# Patient Record
Sex: Female | Born: 1935 | Race: White | Hispanic: No | Smoking: Never smoker
Health system: Southern US, Community
[De-identification: ages and names within clinical notes are randomized; demographics above are authoritative.]

## PROBLEM LIST (undated history)

## (undated) DIAGNOSIS — M810 Age-related osteoporosis without current pathological fracture: Secondary | ICD-10-CM

## (undated) DIAGNOSIS — I10 Essential (primary) hypertension: Secondary | ICD-10-CM

---

## 1997-06-08 ENCOUNTER — Ambulatory Visit (HOSPITAL_COMMUNITY): Admission: RE | Admit: 1997-06-08 | Discharge: 1997-06-08 | Payer: Self-pay | Admitting: Endocrinology

## 1997-08-27 ENCOUNTER — Ambulatory Visit (HOSPITAL_COMMUNITY): Admission: RE | Admit: 1997-08-27 | Discharge: 1997-08-27 | Payer: Self-pay | Admitting: Obstetrics & Gynecology

## 2000-03-14 ENCOUNTER — Ambulatory Visit (HOSPITAL_COMMUNITY): Admission: RE | Admit: 2000-03-14 | Discharge: 2000-03-14 | Payer: Self-pay | Admitting: Specialist

## 2000-05-01 ENCOUNTER — Encounter: Payer: Self-pay | Admitting: Specialist

## 2000-05-02 ENCOUNTER — Ambulatory Visit (HOSPITAL_COMMUNITY): Admission: RE | Admit: 2000-05-02 | Discharge: 2000-05-02 | Payer: Self-pay | Admitting: Specialist

## 2003-12-08 ENCOUNTER — Ambulatory Visit: Payer: Self-pay | Admitting: Family Medicine

## 2006-12-07 ENCOUNTER — Encounter: Payer: Self-pay | Admitting: Family Medicine

## 2006-12-25 ENCOUNTER — Ambulatory Visit: Payer: Self-pay | Admitting: Family Medicine

## 2006-12-25 DIAGNOSIS — E039 Hypothyroidism, unspecified: Secondary | ICD-10-CM | POA: Insufficient documentation

## 2006-12-25 DIAGNOSIS — J309 Allergic rhinitis, unspecified: Secondary | ICD-10-CM | POA: Insufficient documentation

## 2006-12-25 DIAGNOSIS — E785 Hyperlipidemia, unspecified: Secondary | ICD-10-CM

## 2006-12-25 DIAGNOSIS — R32 Unspecified urinary incontinence: Secondary | ICD-10-CM | POA: Insufficient documentation

## 2006-12-26 ENCOUNTER — Telehealth (INDEPENDENT_AMBULATORY_CARE_PROVIDER_SITE_OTHER): Payer: Self-pay | Admitting: *Deleted

## 2006-12-28 ENCOUNTER — Encounter: Payer: Self-pay | Admitting: Family Medicine

## 2007-01-30 ENCOUNTER — Ambulatory Visit: Payer: Self-pay | Admitting: Gastroenterology

## 2007-02-07 ENCOUNTER — Encounter: Payer: Self-pay | Admitting: Gastroenterology

## 2007-02-07 ENCOUNTER — Encounter: Payer: Self-pay | Admitting: Family Medicine

## 2007-02-07 ENCOUNTER — Ambulatory Visit: Payer: Self-pay | Admitting: Gastroenterology

## 2008-04-13 ENCOUNTER — Encounter: Payer: Self-pay | Admitting: Family Medicine

## 2008-10-29 ENCOUNTER — Ambulatory Visit: Payer: Self-pay | Admitting: Family Medicine

## 2008-10-29 DIAGNOSIS — R1011 Right upper quadrant pain: Secondary | ICD-10-CM

## 2008-11-02 ENCOUNTER — Encounter: Admission: RE | Admit: 2008-11-02 | Discharge: 2008-11-02 | Payer: Self-pay | Admitting: Family Medicine

## 2009-02-09 ENCOUNTER — Ambulatory Visit: Payer: Self-pay | Admitting: Family Medicine

## 2009-02-12 LAB — CONVERTED CEMR LAB
ALT: 15 units/L (ref 0–35)
AST: 22 units/L (ref 0–37)
Albumin: 3.8 g/dL (ref 3.5–5.2)
Alkaline Phosphatase: 89 units/L (ref 39–117)
Bilirubin, Direct: 0.1 mg/dL (ref 0.0–0.3)
Cholesterol: 138 mg/dL (ref 0–200)
HDL: 34.1 mg/dL — ABNORMAL LOW (ref >39.00)
LDL Cholesterol: 73 mg/dL (ref 0–99)
Total Bilirubin: 1 mg/dL (ref 0.3–1.2)
Total CHOL/HDL Ratio: 4
Total Protein: 7 g/dL (ref 6.0–8.3)
Triglycerides: 156 mg/dL — ABNORMAL HIGH (ref 0.0–149.0)
VLDL: 31.2 mg/dL (ref 0.0–40.0)

## 2010-02-13 LAB — CONVERTED CEMR LAB
ALT: 16 units/L (ref 0–35)
ALT: 21 units/L (ref 0–35)
AST: 19 units/L (ref 0–37)
AST: 22 units/L (ref 0–37)
Albumin: 3.9 g/dL (ref 3.5–5.2)
Albumin: 4.1 g/dL (ref 3.5–5.2)
Alkaline Phosphatase: 88 units/L (ref 39–117)
Alkaline Phosphatase: 92 units/L (ref 39–117)
Amylase: 42 units/L (ref 27–131)
BUN: 16 mg/dL (ref 6–23)
BUN: 18 mg/dL (ref 6–23)
Basophils Absolute: 0.1 10*3/uL (ref 0.0–0.1)
Basophils Absolute: 0.1 10*3/uL (ref 0.0–0.1)
Basophils Relative: 0.9 % (ref 0.0–1.0)
Basophils Relative: 0.9 % (ref 0.0–3.0)
Bilirubin Urine: NEGATIVE
Bilirubin Urine: NEGATIVE
Bilirubin, Direct: 0 mg/dL (ref 0.0–0.3)
Bilirubin, Direct: 0.2 mg/dL (ref 0.0–0.3)
Blood in Urine, dipstick: NEGATIVE
Blood in Urine, dipstick: NEGATIVE
CO2: 28 meq/L (ref 19–32)
CO2: 31 meq/L (ref 19–32)
Calcium: 9.1 mg/dL (ref 8.4–10.5)
Calcium: 9.8 mg/dL (ref 8.4–10.5)
Chloride: 102 meq/L (ref 96–112)
Chloride: 105 meq/L (ref 96–112)
Cholesterol: 144 mg/dL (ref 0–200)
Cholesterol: 212 mg/dL — ABNORMAL HIGH (ref 0–200)
Creatinine, Ser: 0.8 mg/dL (ref 0.4–1.2)
Creatinine, Ser: 0.8 mg/dL (ref 0.4–1.2)
Direct LDL: 143.2 mg/dL
Eosinophils Absolute: 0.1 10*3/uL (ref 0.0–0.6)
Eosinophils Absolute: 0.1 10*3/uL (ref 0.0–0.7)
Eosinophils Relative: 0.9 % (ref 0.0–5.0)
Eosinophils Relative: 1.9 % (ref 0.0–5.0)
GFR calc Af Amer: 89 mL/min
GFR calc non Af Amer: 72.95 mL/min (ref >60)
GFR calc non Af Amer: 73 mL/min
Glucose, Bld: 108 mg/dL — ABNORMAL HIGH (ref 70–99)
Glucose, Bld: 88 mg/dL (ref 70–99)
Glucose, Urine, Semiquant: NEGATIVE
Glucose, Urine, Semiquant: NEGATIVE
HCT: 42 % (ref 36.0–46.0)
HCT: 43.3 % (ref 36.0–46.0)
HDL: 28.8 mg/dL — ABNORMAL LOW (ref >39.00)
HDL: 31.3 mg/dL — ABNORMAL LOW (ref >39.0)
Hemoglobin: 14.2 g/dL (ref 12.0–15.0)
Hemoglobin: 14.6 g/dL (ref 12.0–15.0)
Ketones, urine, test strip: NEGATIVE
LDL Cholesterol: 88 mg/dL (ref 0–99)
Lipase: 11 units/L (ref 11.0–59.0)
Lymphocytes Relative: 23.6 % (ref 12.0–46.0)
Lymphocytes Relative: 24.8 % (ref 12.0–46.0)
Lymphs Abs: 1.4 10*3/uL (ref 0.7–4.0)
MCHC: 33.7 g/dL (ref 30.0–36.0)
MCHC: 33.7 g/dL (ref 30.0–36.0)
MCV: 86.1 fL (ref 78.0–100.0)
MCV: 86.2 fL (ref 78.0–100.0)
Monocytes Absolute: 0.6 10*3/uL (ref 0.1–1.0)
Monocytes Absolute: 0.9 10*3/uL — ABNORMAL HIGH (ref 0.2–0.7)
Monocytes Relative: 10.8 % (ref 3.0–12.0)
Monocytes Relative: 11 % (ref 3.0–11.0)
Neutro Abs: 3.6 10*3/uL (ref 1.4–7.7)
Neutro Abs: 5.1 10*3/uL (ref 1.4–7.7)
Neutrophils Relative %: 61.6 % (ref 43.0–77.0)
Neutrophils Relative %: 63.6 % (ref 43.0–77.0)
Nitrite: NEGATIVE
Nitrite: NEGATIVE
Platelets: 213 10*3/uL (ref 150.0–400.0)
Platelets: 267 10*3/uL (ref 150–400)
Potassium: 3.8 meq/L (ref 3.5–5.1)
Potassium: 4.1 meq/L (ref 3.5–5.1)
Protein, U semiquant: NEGATIVE
RBC: 4.88 M/uL (ref 3.87–5.11)
RBC: 5.02 M/uL (ref 3.87–5.11)
RDW: 12.8 % (ref 11.5–14.6)
RDW: 13 % (ref 11.5–14.6)
Sodium: 142 meq/L (ref 135–145)
Sodium: 142 meq/L (ref 135–145)
Specific Gravity, Urine: 1.025
Specific Gravity, Urine: 1.025
TSH: 0.71 microintl units/mL (ref 0.35–5.50)
TSH: 1.01 microintl units/mL (ref 0.35–5.50)
Total Bilirubin: 0.9 mg/dL (ref 0.3–1.2)
Total Bilirubin: 1 mg/dL (ref 0.3–1.2)
Total CHOL/HDL Ratio: 4.6
Total CHOL/HDL Ratio: 7
Total Protein: 6.9 g/dL (ref 6.0–8.3)
Total Protein: 7.1 g/dL (ref 6.0–8.3)
Triglycerides: 125 mg/dL (ref 0–149)
Triglycerides: 210 mg/dL — ABNORMAL HIGH (ref 0.0–149.0)
Urobilinogen, UA: 0.2
Urobilinogen, UA: 0.2
VLDL: 25 mg/dL (ref 0–40)
VLDL: 42 mg/dL — ABNORMAL HIGH (ref 0.0–40.0)
WBC: 5.8 10*3/uL (ref 4.5–10.5)
WBC: 8.1 10*3/uL (ref 4.5–10.5)
pH: 5
pH: 5

## 2010-02-17 NOTE — Assessment & Plan Note (Signed)
Summary: fup---will fast//ccm   Vital Signs:  Patient profile:   77 year old female Weight:      191 pounds Temp:     98.3 degrees F oral Pulse rate:   88 / minute BP sitting:   134 / 72  (left arm) Cuff size:   large  Vitals Entered By: Alfred Levins, CMA (February 09, 2009 8:20 AM) CC: f/u, fasting   History of Present Illness: Here to follow up after starting on Zocor about 3 months ago for her elevated lipids. She feels good, and she has made some big adjustments in her diet.   Current Medications (verified): 1)  Zocor 40 Mg Tabs (Simvastatin) .... One By Mouth Daily 2)  Claritin 10 Mg Caps (Loratadine) .... Once Daily  Allergies (verified): No Known Drug Allergies  Past History:  Past Medical History: Allergic rhinitis Hypothyroidism Urinary incontinence, sees Dr. Earlene Plater Hyperlipidemia Positive PPD, treated macular degeneration, sees Dr. Hazle Quant benign thyroid cysts renal cysts  Review of Systems  The patient denies anorexia, fever, weight loss, weight gain, vision loss, decreased hearing, hoarseness, chest pain, syncope, dyspnea on exertion, peripheral edema, prolonged cough, headaches, hemoptysis, abdominal pain, melena, hematochezia, severe indigestion/heartburn, hematuria, incontinence, genital sores, muscle weakness, suspicious skin lesions, transient blindness, difficulty walking, depression, unusual weight change, abnormal bleeding, enlarged lymph nodes, angioedema, breast masses, and testicular masses.    Physical Exam  General:  Well-developed,well-nourished,in no acute distress; alert,appropriate and cooperative throughout examination   Impression & Recommendations:  Problem # 1:  HYPERLIPIDEMIA (ICD-272.4)  Her updated medication list for this problem includes:    Zocor 40 Mg Tabs (Simvastatin) ..... One by mouth daily  Orders: Venipuncture (04540) TLB-Lipid Panel (80061-LIPID) TLB-Hepatic/Liver Function Pnl (80076-HEPATIC)  Complete Medication  List: 1)  Zocor 40 Mg Tabs (Simvastatin) .... One by mouth daily 2)  Claritin 10 Mg Caps (Loratadine) .... Once daily  Patient Instructions: 1)  Get labs today

## 2010-05-31 NOTE — Letter (Signed)
January 30, 2007    Mrs. [name]   RE:  Sydney, Guzman  MRN:  [id number]  /  DOB:  28-Jun-1924   Dear [name]:   It is my pleasure to have treated you recently as a new patient in my  office.  I appreciate your confidence and the opportunity to participate  in your care.   Since I do have a busy inpatient endoscopy schedule and office schedule,  my office hours vary weekly.  I am, however, available for emergency  calls every day through my office.  If I cannot promptly meet an urgent  office appointment, another one of our gastroenterologists will be able  to assist you.   My well-trained staff are prepared to help you at all times.  For  emergencies after office hours, a physician from our gastroenterology  section is always available through my 24-hour answering service.   While you are under my care, I encourage discussion of your questions  and concerns, and I will be happy to return your calls as soon as I am  available.   Once again, I welcome you as a new patient and I look forward to a happy  and healthy relationship.    Sincerely,      Barbette Hair. Arlyce Dice, MD,FACG  Electronically Signed   RDK/MedQ  DD: 01/30/2007  DT: 01/30/2007  Job #: [id number]

## 2010-05-31 NOTE — Assessment & Plan Note (Signed)
[facility]                         GASTROENTEROLOGY OFFICE NOTE   NAME:[name], [name]                      MRN:          [id number]  DATE:01/30/2007                            DOB:          04-22-1924    REASON FOR CONSULTATION:  Colorectal cancer screening.   [name] is a very pleasant 77 year old white female referred through  the courtesy of Dr. Clent Ridges for evaluation.  She has never undergone  screening colonoscopy and has requested one.  Except for mild rectal  discomfort, which she attributes to hemorrhoids, she has no GI  complaints.  Specifically, she is without pain, change in bowel habits,  melena, or hematochezia.   PAST MEDICAL HISTORY:  Pertinent for:  1. Thyroid disease.  2. History of melanoma diagnosed and treated in 1982.  3. She has a cyst on the kidney.  4. Status post hysterectomy.   FAMILY HISTORY:  Pertinent for father who had lung cancer.  Mother died  at 21.   MEDICATIONS:  Include Vytorin, Synthroid, baby aspirin.   ALLERGIES:  No known drug allergies.   SOCIAL HISTORY:  She neither smokes or drinks.  She is married and  retired.   REVIEW OF SYSTEMS:  Positive for some vision changes and muscle cramps.   PHYSICAL EXAMINATION:  VITAL SIGNS:  Pulse 72, blood pressure 140/72,  weight 194.  HEENT: EOMI.  PERRLA.  Sclerae are anicteric.  Conjunctivae are pink.  NECK:  Supple without thyromegaly, adenopathy or carotid bruits.  CHEST:  Clear to auscultation and percussion without adventitious  sounds.  CARDIAC:  Regular rhythm; normal S1 S2.  There are no murmurs, gallops  or rubs.  ABDOMEN:  Bowel sounds are normoactive.  Abdomen is soft, nontender and  nondistended.  There are no abdominal masses, tenderness, splenic  enlargement or hepatomegaly.  EXTREMITIES:  Full range of motion.  No cyanosis, clubbing or edema.  RECTAL:  Deferred.   ASSESSMENT AND PLAN:  I will schedule [name] for screening   colonoscopy.    Barbette Hair. Arlyce Dice, MD,FACG  Electronically Signed   RDK/MedQ  DD: 01/30/2007  DT: 01/30/2007  Job #: [id number]   cc:   Jeannett Senior A. Clent Ridges, MD

## 2010-11-22 ENCOUNTER — Other Ambulatory Visit: Payer: Self-pay | Admitting: Dermatology

## 2011-01-25 DIAGNOSIS — Z1231 Encounter for screening mammogram for malignant neoplasm of breast: Secondary | ICD-10-CM | POA: Diagnosis not present

## 2011-05-29 DIAGNOSIS — L578 Other skin changes due to chronic exposure to nonionizing radiation: Secondary | ICD-10-CM | POA: Diagnosis not present

## 2011-05-29 DIAGNOSIS — H353190 Nonexudative age-related macular degeneration, unspecified eye, stage unspecified: Secondary | ICD-10-CM | POA: Diagnosis not present

## 2011-05-29 DIAGNOSIS — L821 Other seborrheic keratosis: Secondary | ICD-10-CM | POA: Diagnosis not present

## 2011-05-29 DIAGNOSIS — L570 Actinic keratosis: Secondary | ICD-10-CM | POA: Diagnosis not present

## 2011-06-19 DIAGNOSIS — M810 Age-related osteoporosis without current pathological fracture: Secondary | ICD-10-CM | POA: Diagnosis not present

## 2011-06-19 DIAGNOSIS — E785 Hyperlipidemia, unspecified: Secondary | ICD-10-CM | POA: Diagnosis not present

## 2011-06-19 DIAGNOSIS — R7301 Impaired fasting glucose: Secondary | ICD-10-CM | POA: Diagnosis not present

## 2011-06-19 DIAGNOSIS — I10 Essential (primary) hypertension: Secondary | ICD-10-CM | POA: Diagnosis not present

## 2011-10-12 DIAGNOSIS — Z23 Encounter for immunization: Secondary | ICD-10-CM | POA: Diagnosis not present

## 2011-11-07 DIAGNOSIS — M949 Disorder of cartilage, unspecified: Secondary | ICD-10-CM | POA: Diagnosis not present

## 2011-11-07 DIAGNOSIS — M899 Disorder of bone, unspecified: Secondary | ICD-10-CM | POA: Diagnosis not present

## 2011-12-05 DIAGNOSIS — L821 Other seborrheic keratosis: Secondary | ICD-10-CM | POA: Diagnosis not present

## 2011-12-05 DIAGNOSIS — Z85820 Personal history of malignant melanoma of skin: Secondary | ICD-10-CM | POA: Diagnosis not present

## 2011-12-05 DIAGNOSIS — D239 Other benign neoplasm of skin, unspecified: Secondary | ICD-10-CM | POA: Diagnosis not present

## 2011-12-05 DIAGNOSIS — L819 Disorder of pigmentation, unspecified: Secondary | ICD-10-CM | POA: Diagnosis not present

## 2011-12-11 DIAGNOSIS — H353190 Nonexudative age-related macular degeneration, unspecified eye, stage unspecified: Secondary | ICD-10-CM | POA: Diagnosis not present

## 2012-01-30 DIAGNOSIS — H903 Sensorineural hearing loss, bilateral: Secondary | ICD-10-CM | POA: Diagnosis not present

## 2012-02-23 DIAGNOSIS — R82998 Other abnormal findings in urine: Secondary | ICD-10-CM | POA: Diagnosis not present

## 2012-02-23 DIAGNOSIS — R030 Elevated blood-pressure reading, without diagnosis of hypertension: Secondary | ICD-10-CM | POA: Diagnosis not present

## 2012-02-23 DIAGNOSIS — M810 Age-related osteoporosis without current pathological fracture: Secondary | ICD-10-CM | POA: Diagnosis not present

## 2012-02-23 DIAGNOSIS — E785 Hyperlipidemia, unspecified: Secondary | ICD-10-CM | POA: Diagnosis not present

## 2012-02-23 DIAGNOSIS — R7301 Impaired fasting glucose: Secondary | ICD-10-CM | POA: Diagnosis not present

## 2012-03-04 DIAGNOSIS — Z1212 Encounter for screening for malignant neoplasm of rectum: Secondary | ICD-10-CM | POA: Diagnosis not present

## 2012-03-13 DIAGNOSIS — I10 Essential (primary) hypertension: Secondary | ICD-10-CM | POA: Diagnosis not present

## 2012-03-13 DIAGNOSIS — E785 Hyperlipidemia, unspecified: Secondary | ICD-10-CM | POA: Diagnosis not present

## 2012-03-13 DIAGNOSIS — Z124 Encounter for screening for malignant neoplasm of cervix: Secondary | ICD-10-CM | POA: Diagnosis not present

## 2012-03-13 DIAGNOSIS — Z0000 Encounter for general adult medical examination without abnormal findings: Secondary | ICD-10-CM | POA: Diagnosis not present

## 2012-03-13 DIAGNOSIS — Z1331 Encounter for screening for depression: Secondary | ICD-10-CM | POA: Diagnosis not present

## 2012-06-03 ENCOUNTER — Other Ambulatory Visit: Payer: Self-pay | Admitting: Dermatology

## 2012-06-03 DIAGNOSIS — D2360 Other benign neoplasm of skin of unspecified upper limb, including shoulder: Secondary | ICD-10-CM | POA: Diagnosis not present

## 2012-06-03 DIAGNOSIS — D485 Neoplasm of uncertain behavior of skin: Secondary | ICD-10-CM | POA: Diagnosis not present

## 2012-06-03 DIAGNOSIS — L819 Disorder of pigmentation, unspecified: Secondary | ICD-10-CM | POA: Diagnosis not present

## 2012-06-03 DIAGNOSIS — Z85820 Personal history of malignant melanoma of skin: Secondary | ICD-10-CM | POA: Diagnosis not present

## 2012-06-03 DIAGNOSIS — L821 Other seborrheic keratosis: Secondary | ICD-10-CM | POA: Diagnosis not present

## 2012-06-03 DIAGNOSIS — Z85828 Personal history of other malignant neoplasm of skin: Secondary | ICD-10-CM | POA: Diagnosis not present

## 2012-06-03 DIAGNOSIS — L578 Other skin changes due to chronic exposure to nonionizing radiation: Secondary | ICD-10-CM | POA: Diagnosis not present

## 2012-07-18 DIAGNOSIS — Z1231 Encounter for screening mammogram for malignant neoplasm of breast: Secondary | ICD-10-CM | POA: Diagnosis not present

## 2012-08-12 DIAGNOSIS — H353190 Nonexudative age-related macular degeneration, unspecified eye, stage unspecified: Secondary | ICD-10-CM | POA: Diagnosis not present

## 2012-08-14 DIAGNOSIS — Z23 Encounter for immunization: Secondary | ICD-10-CM | POA: Diagnosis not present

## 2012-11-11 DIAGNOSIS — Z23 Encounter for immunization: Secondary | ICD-10-CM | POA: Diagnosis not present

## 2013-02-13 DIAGNOSIS — H04129 Dry eye syndrome of unspecified lacrimal gland: Secondary | ICD-10-CM | POA: Diagnosis not present

## 2013-02-13 DIAGNOSIS — H353190 Nonexudative age-related macular degeneration, unspecified eye, stage unspecified: Secondary | ICD-10-CM | POA: Diagnosis not present

## 2013-03-14 DIAGNOSIS — E785 Hyperlipidemia, unspecified: Secondary | ICD-10-CM | POA: Diagnosis not present

## 2013-03-14 DIAGNOSIS — M810 Age-related osteoporosis without current pathological fracture: Secondary | ICD-10-CM | POA: Diagnosis not present

## 2013-03-14 DIAGNOSIS — I10 Essential (primary) hypertension: Secondary | ICD-10-CM | POA: Diagnosis not present

## 2013-03-14 DIAGNOSIS — R7301 Impaired fasting glucose: Secondary | ICD-10-CM | POA: Diagnosis not present

## 2013-03-14 DIAGNOSIS — R82998 Other abnormal findings in urine: Secondary | ICD-10-CM | POA: Diagnosis not present

## 2013-03-20 DIAGNOSIS — Z1212 Encounter for screening for malignant neoplasm of rectum: Secondary | ICD-10-CM | POA: Diagnosis not present

## 2013-03-20 DIAGNOSIS — M810 Age-related osteoporosis without current pathological fracture: Secondary | ICD-10-CM | POA: Diagnosis not present

## 2013-03-20 DIAGNOSIS — E669 Obesity, unspecified: Secondary | ICD-10-CM | POA: Diagnosis not present

## 2013-03-20 DIAGNOSIS — R030 Elevated blood-pressure reading, without diagnosis of hypertension: Secondary | ICD-10-CM | POA: Diagnosis not present

## 2013-03-20 DIAGNOSIS — Z0000 Encounter for general adult medical examination without abnormal findings: Secondary | ICD-10-CM | POA: Diagnosis not present

## 2013-03-20 DIAGNOSIS — Z1331 Encounter for screening for depression: Secondary | ICD-10-CM | POA: Diagnosis not present

## 2013-03-20 DIAGNOSIS — I10 Essential (primary) hypertension: Secondary | ICD-10-CM | POA: Diagnosis not present

## 2013-03-20 DIAGNOSIS — E785 Hyperlipidemia, unspecified: Secondary | ICD-10-CM | POA: Diagnosis not present

## 2013-03-20 DIAGNOSIS — R7301 Impaired fasting glucose: Secondary | ICD-10-CM | POA: Diagnosis not present

## 2013-05-29 DIAGNOSIS — M545 Low back pain, unspecified: Secondary | ICD-10-CM | POA: Diagnosis not present

## 2013-05-29 DIAGNOSIS — M47814 Spondylosis without myelopathy or radiculopathy, thoracic region: Secondary | ICD-10-CM | POA: Diagnosis not present

## 2013-05-29 DIAGNOSIS — M47817 Spondylosis without myelopathy or radiculopathy, lumbosacral region: Secondary | ICD-10-CM | POA: Diagnosis not present

## 2013-05-29 DIAGNOSIS — Z6832 Body mass index (BMI) 32.0-32.9, adult: Secondary | ICD-10-CM | POA: Diagnosis not present

## 2013-06-05 DIAGNOSIS — IMO0002 Reserved for concepts with insufficient information to code with codable children: Secondary | ICD-10-CM | POA: Diagnosis not present

## 2013-06-05 DIAGNOSIS — M25659 Stiffness of unspecified hip, not elsewhere classified: Secondary | ICD-10-CM | POA: Diagnosis not present

## 2013-06-05 DIAGNOSIS — M999 Biomechanical lesion, unspecified: Secondary | ICD-10-CM | POA: Diagnosis not present

## 2013-06-05 DIAGNOSIS — M5137 Other intervertebral disc degeneration, lumbosacral region: Secondary | ICD-10-CM | POA: Diagnosis not present

## 2013-06-10 DIAGNOSIS — M5137 Other intervertebral disc degeneration, lumbosacral region: Secondary | ICD-10-CM | POA: Diagnosis not present

## 2013-06-10 DIAGNOSIS — M25659 Stiffness of unspecified hip, not elsewhere classified: Secondary | ICD-10-CM | POA: Diagnosis not present

## 2013-06-10 DIAGNOSIS — M999 Biomechanical lesion, unspecified: Secondary | ICD-10-CM | POA: Diagnosis not present

## 2013-06-10 DIAGNOSIS — IMO0002 Reserved for concepts with insufficient information to code with codable children: Secondary | ICD-10-CM | POA: Diagnosis not present

## 2013-06-11 DIAGNOSIS — M999 Biomechanical lesion, unspecified: Secondary | ICD-10-CM | POA: Diagnosis not present

## 2013-06-11 DIAGNOSIS — IMO0002 Reserved for concepts with insufficient information to code with codable children: Secondary | ICD-10-CM | POA: Diagnosis not present

## 2013-06-11 DIAGNOSIS — M25659 Stiffness of unspecified hip, not elsewhere classified: Secondary | ICD-10-CM | POA: Diagnosis not present

## 2013-06-11 DIAGNOSIS — M5137 Other intervertebral disc degeneration, lumbosacral region: Secondary | ICD-10-CM | POA: Diagnosis not present

## 2013-06-13 DIAGNOSIS — IMO0002 Reserved for concepts with insufficient information to code with codable children: Secondary | ICD-10-CM | POA: Diagnosis not present

## 2013-06-13 DIAGNOSIS — M25659 Stiffness of unspecified hip, not elsewhere classified: Secondary | ICD-10-CM | POA: Diagnosis not present

## 2013-06-13 DIAGNOSIS — M5137 Other intervertebral disc degeneration, lumbosacral region: Secondary | ICD-10-CM | POA: Diagnosis not present

## 2013-06-13 DIAGNOSIS — M999 Biomechanical lesion, unspecified: Secondary | ICD-10-CM | POA: Diagnosis not present

## 2013-06-16 DIAGNOSIS — IMO0002 Reserved for concepts with insufficient information to code with codable children: Secondary | ICD-10-CM | POA: Diagnosis not present

## 2013-06-16 DIAGNOSIS — M25659 Stiffness of unspecified hip, not elsewhere classified: Secondary | ICD-10-CM | POA: Diagnosis not present

## 2013-06-16 DIAGNOSIS — M999 Biomechanical lesion, unspecified: Secondary | ICD-10-CM | POA: Diagnosis not present

## 2013-06-16 DIAGNOSIS — M5137 Other intervertebral disc degeneration, lumbosacral region: Secondary | ICD-10-CM | POA: Diagnosis not present

## 2013-06-17 DIAGNOSIS — M5137 Other intervertebral disc degeneration, lumbosacral region: Secondary | ICD-10-CM | POA: Diagnosis not present

## 2013-06-17 DIAGNOSIS — M999 Biomechanical lesion, unspecified: Secondary | ICD-10-CM | POA: Diagnosis not present

## 2013-06-17 DIAGNOSIS — IMO0002 Reserved for concepts with insufficient information to code with codable children: Secondary | ICD-10-CM | POA: Diagnosis not present

## 2013-06-17 DIAGNOSIS — M25659 Stiffness of unspecified hip, not elsewhere classified: Secondary | ICD-10-CM | POA: Diagnosis not present

## 2013-06-19 DIAGNOSIS — M25659 Stiffness of unspecified hip, not elsewhere classified: Secondary | ICD-10-CM | POA: Diagnosis not present

## 2013-06-19 DIAGNOSIS — M999 Biomechanical lesion, unspecified: Secondary | ICD-10-CM | POA: Diagnosis not present

## 2013-06-19 DIAGNOSIS — IMO0002 Reserved for concepts with insufficient information to code with codable children: Secondary | ICD-10-CM | POA: Diagnosis not present

## 2013-06-19 DIAGNOSIS — M5137 Other intervertebral disc degeneration, lumbosacral region: Secondary | ICD-10-CM | POA: Diagnosis not present

## 2013-06-23 DIAGNOSIS — M5137 Other intervertebral disc degeneration, lumbosacral region: Secondary | ICD-10-CM | POA: Diagnosis not present

## 2013-06-23 DIAGNOSIS — IMO0002 Reserved for concepts with insufficient information to code with codable children: Secondary | ICD-10-CM | POA: Diagnosis not present

## 2013-06-23 DIAGNOSIS — M25659 Stiffness of unspecified hip, not elsewhere classified: Secondary | ICD-10-CM | POA: Diagnosis not present

## 2013-06-23 DIAGNOSIS — M999 Biomechanical lesion, unspecified: Secondary | ICD-10-CM | POA: Diagnosis not present

## 2013-06-24 DIAGNOSIS — M999 Biomechanical lesion, unspecified: Secondary | ICD-10-CM | POA: Diagnosis not present

## 2013-06-24 DIAGNOSIS — IMO0002 Reserved for concepts with insufficient information to code with codable children: Secondary | ICD-10-CM | POA: Diagnosis not present

## 2013-06-24 DIAGNOSIS — M5137 Other intervertebral disc degeneration, lumbosacral region: Secondary | ICD-10-CM | POA: Diagnosis not present

## 2013-06-24 DIAGNOSIS — M25659 Stiffness of unspecified hip, not elsewhere classified: Secondary | ICD-10-CM | POA: Diagnosis not present

## 2013-06-26 DIAGNOSIS — IMO0002 Reserved for concepts with insufficient information to code with codable children: Secondary | ICD-10-CM | POA: Diagnosis not present

## 2013-06-26 DIAGNOSIS — M25659 Stiffness of unspecified hip, not elsewhere classified: Secondary | ICD-10-CM | POA: Diagnosis not present

## 2013-06-26 DIAGNOSIS — M999 Biomechanical lesion, unspecified: Secondary | ICD-10-CM | POA: Diagnosis not present

## 2013-06-26 DIAGNOSIS — M5137 Other intervertebral disc degeneration, lumbosacral region: Secondary | ICD-10-CM | POA: Diagnosis not present

## 2013-06-30 DIAGNOSIS — M5137 Other intervertebral disc degeneration, lumbosacral region: Secondary | ICD-10-CM | POA: Diagnosis not present

## 2013-06-30 DIAGNOSIS — M999 Biomechanical lesion, unspecified: Secondary | ICD-10-CM | POA: Diagnosis not present

## 2013-06-30 DIAGNOSIS — M25659 Stiffness of unspecified hip, not elsewhere classified: Secondary | ICD-10-CM | POA: Diagnosis not present

## 2013-06-30 DIAGNOSIS — IMO0002 Reserved for concepts with insufficient information to code with codable children: Secondary | ICD-10-CM | POA: Diagnosis not present

## 2013-07-03 DIAGNOSIS — M25659 Stiffness of unspecified hip, not elsewhere classified: Secondary | ICD-10-CM | POA: Diagnosis not present

## 2013-07-03 DIAGNOSIS — IMO0002 Reserved for concepts with insufficient information to code with codable children: Secondary | ICD-10-CM | POA: Diagnosis not present

## 2013-07-03 DIAGNOSIS — M5137 Other intervertebral disc degeneration, lumbosacral region: Secondary | ICD-10-CM | POA: Diagnosis not present

## 2013-07-03 DIAGNOSIS — M999 Biomechanical lesion, unspecified: Secondary | ICD-10-CM | POA: Diagnosis not present

## 2013-07-07 DIAGNOSIS — IMO0002 Reserved for concepts with insufficient information to code with codable children: Secondary | ICD-10-CM | POA: Diagnosis not present

## 2013-07-07 DIAGNOSIS — M999 Biomechanical lesion, unspecified: Secondary | ICD-10-CM | POA: Diagnosis not present

## 2013-07-07 DIAGNOSIS — M25659 Stiffness of unspecified hip, not elsewhere classified: Secondary | ICD-10-CM | POA: Diagnosis not present

## 2013-07-07 DIAGNOSIS — M5137 Other intervertebral disc degeneration, lumbosacral region: Secondary | ICD-10-CM | POA: Diagnosis not present

## 2013-07-10 DIAGNOSIS — M5137 Other intervertebral disc degeneration, lumbosacral region: Secondary | ICD-10-CM | POA: Diagnosis not present

## 2013-07-10 DIAGNOSIS — M999 Biomechanical lesion, unspecified: Secondary | ICD-10-CM | POA: Diagnosis not present

## 2013-07-10 DIAGNOSIS — M25659 Stiffness of unspecified hip, not elsewhere classified: Secondary | ICD-10-CM | POA: Diagnosis not present

## 2013-07-10 DIAGNOSIS — IMO0002 Reserved for concepts with insufficient information to code with codable children: Secondary | ICD-10-CM | POA: Diagnosis not present

## 2013-07-14 DIAGNOSIS — M25659 Stiffness of unspecified hip, not elsewhere classified: Secondary | ICD-10-CM | POA: Diagnosis not present

## 2013-07-14 DIAGNOSIS — M5137 Other intervertebral disc degeneration, lumbosacral region: Secondary | ICD-10-CM | POA: Diagnosis not present

## 2013-07-14 DIAGNOSIS — IMO0002 Reserved for concepts with insufficient information to code with codable children: Secondary | ICD-10-CM | POA: Diagnosis not present

## 2013-07-14 DIAGNOSIS — M999 Biomechanical lesion, unspecified: Secondary | ICD-10-CM | POA: Diagnosis not present

## 2013-07-17 DIAGNOSIS — M999 Biomechanical lesion, unspecified: Secondary | ICD-10-CM | POA: Diagnosis not present

## 2013-07-17 DIAGNOSIS — M5137 Other intervertebral disc degeneration, lumbosacral region: Secondary | ICD-10-CM | POA: Diagnosis not present

## 2013-07-17 DIAGNOSIS — M25659 Stiffness of unspecified hip, not elsewhere classified: Secondary | ICD-10-CM | POA: Diagnosis not present

## 2013-07-17 DIAGNOSIS — IMO0002 Reserved for concepts with insufficient information to code with codable children: Secondary | ICD-10-CM | POA: Diagnosis not present

## 2013-07-24 DIAGNOSIS — M25659 Stiffness of unspecified hip, not elsewhere classified: Secondary | ICD-10-CM | POA: Diagnosis not present

## 2013-07-24 DIAGNOSIS — M5137 Other intervertebral disc degeneration, lumbosacral region: Secondary | ICD-10-CM | POA: Diagnosis not present

## 2013-07-24 DIAGNOSIS — M999 Biomechanical lesion, unspecified: Secondary | ICD-10-CM | POA: Diagnosis not present

## 2013-07-24 DIAGNOSIS — IMO0002 Reserved for concepts with insufficient information to code with codable children: Secondary | ICD-10-CM | POA: Diagnosis not present

## 2013-08-07 DIAGNOSIS — M25659 Stiffness of unspecified hip, not elsewhere classified: Secondary | ICD-10-CM | POA: Diagnosis not present

## 2013-08-07 DIAGNOSIS — M999 Biomechanical lesion, unspecified: Secondary | ICD-10-CM | POA: Diagnosis not present

## 2013-08-07 DIAGNOSIS — M5137 Other intervertebral disc degeneration, lumbosacral region: Secondary | ICD-10-CM | POA: Diagnosis not present

## 2013-08-07 DIAGNOSIS — IMO0002 Reserved for concepts with insufficient information to code with codable children: Secondary | ICD-10-CM | POA: Diagnosis not present

## 2013-08-14 DIAGNOSIS — H04129 Dry eye syndrome of unspecified lacrimal gland: Secondary | ICD-10-CM | POA: Diagnosis not present

## 2013-08-14 DIAGNOSIS — H353190 Nonexudative age-related macular degeneration, unspecified eye, stage unspecified: Secondary | ICD-10-CM | POA: Diagnosis not present

## 2013-08-14 DIAGNOSIS — H40019 Open angle with borderline findings, low risk, unspecified eye: Secondary | ICD-10-CM | POA: Diagnosis not present

## 2013-09-18 DIAGNOSIS — M25579 Pain in unspecified ankle and joints of unspecified foot: Secondary | ICD-10-CM | POA: Diagnosis not present

## 2013-09-18 DIAGNOSIS — Z6832 Body mass index (BMI) 32.0-32.9, adult: Secondary | ICD-10-CM | POA: Diagnosis not present

## 2013-11-07 DIAGNOSIS — Z23 Encounter for immunization: Secondary | ICD-10-CM | POA: Diagnosis not present

## 2013-11-18 DIAGNOSIS — M8588 Other specified disorders of bone density and structure, other site: Secondary | ICD-10-CM | POA: Diagnosis not present

## 2014-01-29 DIAGNOSIS — Z1231 Encounter for screening mammogram for malignant neoplasm of breast: Secondary | ICD-10-CM | POA: Diagnosis not present

## 2014-03-23 DIAGNOSIS — R7301 Impaired fasting glucose: Secondary | ICD-10-CM | POA: Diagnosis not present

## 2014-03-23 DIAGNOSIS — E785 Hyperlipidemia, unspecified: Secondary | ICD-10-CM | POA: Diagnosis not present

## 2014-03-23 DIAGNOSIS — M810 Age-related osteoporosis without current pathological fracture: Secondary | ICD-10-CM | POA: Diagnosis not present

## 2014-03-23 DIAGNOSIS — Z008 Encounter for other general examination: Secondary | ICD-10-CM | POA: Diagnosis not present

## 2014-03-23 DIAGNOSIS — R8299 Other abnormal findings in urine: Secondary | ICD-10-CM | POA: Diagnosis not present

## 2014-03-23 DIAGNOSIS — I10 Essential (primary) hypertension: Secondary | ICD-10-CM | POA: Diagnosis not present

## 2014-03-30 DIAGNOSIS — Z6838 Body mass index (BMI) 38.0-38.9, adult: Secondary | ICD-10-CM | POA: Diagnosis not present

## 2014-03-30 DIAGNOSIS — M25512 Pain in left shoulder: Secondary | ICD-10-CM | POA: Diagnosis not present

## 2014-03-30 DIAGNOSIS — E669 Obesity, unspecified: Secondary | ICD-10-CM | POA: Diagnosis not present

## 2014-03-30 DIAGNOSIS — R030 Elevated blood-pressure reading, without diagnosis of hypertension: Secondary | ICD-10-CM | POA: Diagnosis not present

## 2014-03-30 DIAGNOSIS — Z1231 Encounter for screening mammogram for malignant neoplasm of breast: Secondary | ICD-10-CM | POA: Diagnosis not present

## 2014-03-30 DIAGNOSIS — Z1389 Encounter for screening for other disorder: Secondary | ICD-10-CM | POA: Diagnosis not present

## 2014-03-30 DIAGNOSIS — I10 Essential (primary) hypertension: Secondary | ICD-10-CM | POA: Diagnosis not present

## 2014-03-30 DIAGNOSIS — M810 Age-related osteoporosis without current pathological fracture: Secondary | ICD-10-CM | POA: Diagnosis not present

## 2014-03-30 DIAGNOSIS — R7301 Impaired fasting glucose: Secondary | ICD-10-CM | POA: Diagnosis not present

## 2014-03-30 DIAGNOSIS — Z0000 Encounter for general adult medical examination without abnormal findings: Secondary | ICD-10-CM | POA: Diagnosis not present

## 2014-03-30 DIAGNOSIS — M545 Low back pain: Secondary | ICD-10-CM | POA: Diagnosis not present

## 2014-03-30 DIAGNOSIS — E785 Hyperlipidemia, unspecified: Secondary | ICD-10-CM | POA: Diagnosis not present

## 2014-04-01 DIAGNOSIS — Z1212 Encounter for screening for malignant neoplasm of rectum: Secondary | ICD-10-CM | POA: Diagnosis not present

## 2014-04-08 DIAGNOSIS — M25552 Pain in left hip: Secondary | ICD-10-CM | POA: Diagnosis not present

## 2014-04-08 DIAGNOSIS — M7542 Impingement syndrome of left shoulder: Secondary | ICD-10-CM | POA: Diagnosis not present

## 2014-11-24 ENCOUNTER — Encounter: Payer: Self-pay | Admitting: Gastroenterology

## 2014-12-29 DIAGNOSIS — H524 Presbyopia: Secondary | ICD-10-CM | POA: Diagnosis not present

## 2014-12-29 DIAGNOSIS — H353133 Nonexudative age-related macular degeneration, bilateral, advanced atrophic without subfoveal involvement: Secondary | ICD-10-CM | POA: Diagnosis not present

## 2014-12-29 DIAGNOSIS — H04123 Dry eye syndrome of bilateral lacrimal glands: Secondary | ICD-10-CM | POA: Diagnosis not present

## 2014-12-29 DIAGNOSIS — H5213 Myopia, bilateral: Secondary | ICD-10-CM | POA: Diagnosis not present

## 2015-01-05 DIAGNOSIS — L304 Erythema intertrigo: Secondary | ICD-10-CM | POA: Diagnosis not present

## 2015-01-05 DIAGNOSIS — L570 Actinic keratosis: Secondary | ICD-10-CM | POA: Diagnosis not present

## 2015-01-05 DIAGNOSIS — L821 Other seborrheic keratosis: Secondary | ICD-10-CM | POA: Diagnosis not present

## 2015-01-05 DIAGNOSIS — C44712 Basal cell carcinoma of skin of right lower limb, including hip: Secondary | ICD-10-CM | POA: Diagnosis not present

## 2015-01-05 DIAGNOSIS — Z85828 Personal history of other malignant neoplasm of skin: Secondary | ICD-10-CM | POA: Diagnosis not present

## 2015-01-05 DIAGNOSIS — C44319 Basal cell carcinoma of skin of other parts of face: Secondary | ICD-10-CM | POA: Diagnosis not present

## 2015-01-05 DIAGNOSIS — L814 Other melanin hyperpigmentation: Secondary | ICD-10-CM | POA: Diagnosis not present

## 2015-01-05 DIAGNOSIS — Z85820 Personal history of malignant melanoma of skin: Secondary | ICD-10-CM | POA: Diagnosis not present

## 2015-01-05 DIAGNOSIS — D1801 Hemangioma of skin and subcutaneous tissue: Secondary | ICD-10-CM | POA: Diagnosis not present

## 2015-03-03 DIAGNOSIS — Z1231 Encounter for screening mammogram for malignant neoplasm of breast: Secondary | ICD-10-CM | POA: Diagnosis not present

## 2015-04-20 DIAGNOSIS — Z6832 Body mass index (BMI) 32.0-32.9, adult: Secondary | ICD-10-CM | POA: Diagnosis not present

## 2015-04-20 DIAGNOSIS — I10 Essential (primary) hypertension: Secondary | ICD-10-CM | POA: Diagnosis not present

## 2015-04-20 DIAGNOSIS — H6123 Impacted cerumen, bilateral: Secondary | ICD-10-CM | POA: Diagnosis not present

## 2015-05-19 DIAGNOSIS — N390 Urinary tract infection, site not specified: Secondary | ICD-10-CM | POA: Diagnosis not present

## 2015-05-19 DIAGNOSIS — R8299 Other abnormal findings in urine: Secondary | ICD-10-CM | POA: Diagnosis not present

## 2015-05-19 DIAGNOSIS — E784 Other hyperlipidemia: Secondary | ICD-10-CM | POA: Diagnosis not present

## 2015-05-19 DIAGNOSIS — R7301 Impaired fasting glucose: Secondary | ICD-10-CM | POA: Diagnosis not present

## 2015-05-19 DIAGNOSIS — I10 Essential (primary) hypertension: Secondary | ICD-10-CM | POA: Diagnosis not present

## 2015-05-19 DIAGNOSIS — M810 Age-related osteoporosis without current pathological fracture: Secondary | ICD-10-CM | POA: Diagnosis not present

## 2015-05-26 DIAGNOSIS — Z1231 Encounter for screening mammogram for malignant neoplasm of breast: Secondary | ICD-10-CM | POA: Diagnosis not present

## 2015-05-26 DIAGNOSIS — Z1389 Encounter for screening for other disorder: Secondary | ICD-10-CM | POA: Diagnosis not present

## 2015-05-26 DIAGNOSIS — E668 Other obesity: Secondary | ICD-10-CM | POA: Diagnosis not present

## 2015-05-26 DIAGNOSIS — I10 Essential (primary) hypertension: Secondary | ICD-10-CM | POA: Diagnosis not present

## 2015-05-26 DIAGNOSIS — Z6832 Body mass index (BMI) 32.0-32.9, adult: Secondary | ICD-10-CM | POA: Diagnosis not present

## 2015-05-26 DIAGNOSIS — E784 Other hyperlipidemia: Secondary | ICD-10-CM | POA: Diagnosis not present

## 2015-05-26 DIAGNOSIS — R7301 Impaired fasting glucose: Secondary | ICD-10-CM | POA: Diagnosis not present

## 2015-05-26 DIAGNOSIS — M545 Low back pain: Secondary | ICD-10-CM | POA: Diagnosis not present

## 2015-05-26 DIAGNOSIS — Z0000 Encounter for general adult medical examination without abnormal findings: Secondary | ICD-10-CM | POA: Diagnosis not present

## 2015-05-26 DIAGNOSIS — M810 Age-related osteoporosis without current pathological fracture: Secondary | ICD-10-CM | POA: Diagnosis not present

## 2015-07-01 DIAGNOSIS — H524 Presbyopia: Secondary | ICD-10-CM | POA: Diagnosis not present

## 2015-07-01 DIAGNOSIS — Z961 Presence of intraocular lens: Secondary | ICD-10-CM | POA: Diagnosis not present

## 2015-07-01 DIAGNOSIS — H5213 Myopia, bilateral: Secondary | ICD-10-CM | POA: Diagnosis not present

## 2015-07-01 DIAGNOSIS — H04123 Dry eye syndrome of bilateral lacrimal glands: Secondary | ICD-10-CM | POA: Diagnosis not present

## 2015-07-01 DIAGNOSIS — H353133 Nonexudative age-related macular degeneration, bilateral, advanced atrophic without subfoveal involvement: Secondary | ICD-10-CM | POA: Diagnosis not present

## 2015-11-08 DIAGNOSIS — M545 Low back pain: Secondary | ICD-10-CM | POA: Diagnosis not present

## 2015-11-15 DIAGNOSIS — M545 Low back pain: Secondary | ICD-10-CM | POA: Diagnosis not present

## 2015-11-19 DIAGNOSIS — Z23 Encounter for immunization: Secondary | ICD-10-CM | POA: Diagnosis not present

## 2015-11-22 DIAGNOSIS — M545 Low back pain: Secondary | ICD-10-CM | POA: Diagnosis not present

## 2016-01-27 DIAGNOSIS — M810 Age-related osteoporosis without current pathological fracture: Secondary | ICD-10-CM | POA: Diagnosis not present

## 2016-03-13 DIAGNOSIS — Z1231 Encounter for screening mammogram for malignant neoplasm of breast: Secondary | ICD-10-CM | POA: Diagnosis not present

## 2016-04-13 DIAGNOSIS — R509 Fever, unspecified: Secondary | ICD-10-CM | POA: Diagnosis not present

## 2016-05-26 DIAGNOSIS — D1801 Hemangioma of skin and subcutaneous tissue: Secondary | ICD-10-CM | POA: Diagnosis not present

## 2016-05-26 DIAGNOSIS — C44311 Basal cell carcinoma of skin of nose: Secondary | ICD-10-CM | POA: Diagnosis not present

## 2016-05-26 DIAGNOSIS — C4441 Basal cell carcinoma of skin of scalp and neck: Secondary | ICD-10-CM | POA: Diagnosis not present

## 2016-05-26 DIAGNOSIS — L820 Inflamed seborrheic keratosis: Secondary | ICD-10-CM | POA: Diagnosis not present

## 2016-05-26 DIAGNOSIS — Z85820 Personal history of malignant melanoma of skin: Secondary | ICD-10-CM | POA: Diagnosis not present

## 2016-05-26 DIAGNOSIS — L821 Other seborrheic keratosis: Secondary | ICD-10-CM | POA: Diagnosis not present

## 2016-05-26 DIAGNOSIS — L814 Other melanin hyperpigmentation: Secondary | ICD-10-CM | POA: Diagnosis not present

## 2016-05-26 DIAGNOSIS — Z85828 Personal history of other malignant neoplasm of skin: Secondary | ICD-10-CM | POA: Diagnosis not present

## 2016-06-07 DIAGNOSIS — C44311 Basal cell carcinoma of skin of nose: Secondary | ICD-10-CM | POA: Diagnosis not present

## 2016-06-07 DIAGNOSIS — Z85828 Personal history of other malignant neoplasm of skin: Secondary | ICD-10-CM | POA: Diagnosis not present

## 2016-06-07 DIAGNOSIS — Z85820 Personal history of malignant melanoma of skin: Secondary | ICD-10-CM | POA: Diagnosis not present

## 2016-06-14 DIAGNOSIS — E784 Other hyperlipidemia: Secondary | ICD-10-CM | POA: Diagnosis not present

## 2016-06-14 DIAGNOSIS — I10 Essential (primary) hypertension: Secondary | ICD-10-CM | POA: Diagnosis not present

## 2016-06-14 DIAGNOSIS — M810 Age-related osteoporosis without current pathological fracture: Secondary | ICD-10-CM | POA: Diagnosis not present

## 2016-06-14 DIAGNOSIS — R8299 Other abnormal findings in urine: Secondary | ICD-10-CM | POA: Diagnosis not present

## 2016-06-14 DIAGNOSIS — R7301 Impaired fasting glucose: Secondary | ICD-10-CM | POA: Diagnosis not present

## 2016-06-14 DIAGNOSIS — N390 Urinary tract infection, site not specified: Secondary | ICD-10-CM | POA: Diagnosis not present

## 2016-06-15 DIAGNOSIS — Z85828 Personal history of other malignant neoplasm of skin: Secondary | ICD-10-CM | POA: Diagnosis not present

## 2016-06-15 DIAGNOSIS — Z85820 Personal history of malignant melanoma of skin: Secondary | ICD-10-CM | POA: Diagnosis not present

## 2016-06-15 DIAGNOSIS — C4441 Basal cell carcinoma of skin of scalp and neck: Secondary | ICD-10-CM | POA: Diagnosis not present

## 2016-06-21 DIAGNOSIS — Z0000 Encounter for general adult medical examination without abnormal findings: Secondary | ICD-10-CM | POA: Diagnosis not present

## 2016-06-21 DIAGNOSIS — E784 Other hyperlipidemia: Secondary | ICD-10-CM | POA: Diagnosis not present

## 2016-06-21 DIAGNOSIS — E668 Other obesity: Secondary | ICD-10-CM | POA: Diagnosis not present

## 2016-06-21 DIAGNOSIS — Z1389 Encounter for screening for other disorder: Secondary | ICD-10-CM | POA: Diagnosis not present

## 2016-06-21 DIAGNOSIS — M545 Low back pain: Secondary | ICD-10-CM | POA: Diagnosis not present

## 2016-06-21 DIAGNOSIS — R7301 Impaired fasting glucose: Secondary | ICD-10-CM | POA: Diagnosis not present

## 2016-06-21 DIAGNOSIS — M810 Age-related osteoporosis without current pathological fracture: Secondary | ICD-10-CM | POA: Diagnosis not present

## 2016-06-21 DIAGNOSIS — I10 Essential (primary) hypertension: Secondary | ICD-10-CM | POA: Diagnosis not present

## 2016-06-21 DIAGNOSIS — M25571 Pain in right ankle and joints of right foot: Secondary | ICD-10-CM | POA: Diagnosis not present

## 2016-06-21 DIAGNOSIS — M25512 Pain in left shoulder: Secondary | ICD-10-CM | POA: Diagnosis not present

## 2016-06-21 DIAGNOSIS — Z1231 Encounter for screening mammogram for malignant neoplasm of breast: Secondary | ICD-10-CM | POA: Diagnosis not present

## 2016-06-21 DIAGNOSIS — Z6832 Body mass index (BMI) 32.0-32.9, adult: Secondary | ICD-10-CM | POA: Diagnosis not present

## 2016-09-25 DIAGNOSIS — H524 Presbyopia: Secondary | ICD-10-CM | POA: Diagnosis not present

## 2016-09-25 DIAGNOSIS — H04123 Dry eye syndrome of bilateral lacrimal glands: Secondary | ICD-10-CM | POA: Diagnosis not present

## 2016-09-25 DIAGNOSIS — H1859 Other hereditary corneal dystrophies: Secondary | ICD-10-CM | POA: Diagnosis not present

## 2016-09-25 DIAGNOSIS — H353133 Nonexudative age-related macular degeneration, bilateral, advanced atrophic without subfoveal involvement: Secondary | ICD-10-CM | POA: Diagnosis not present

## 2016-09-25 DIAGNOSIS — H5213 Myopia, bilateral: Secondary | ICD-10-CM | POA: Diagnosis not present

## 2016-09-25 DIAGNOSIS — H52223 Regular astigmatism, bilateral: Secondary | ICD-10-CM | POA: Diagnosis not present

## 2016-09-25 DIAGNOSIS — Z961 Presence of intraocular lens: Secondary | ICD-10-CM | POA: Diagnosis not present

## 2016-10-20 DIAGNOSIS — Z23 Encounter for immunization: Secondary | ICD-10-CM | POA: Diagnosis not present

## 2016-12-20 DIAGNOSIS — M109 Gout, unspecified: Secondary | ICD-10-CM | POA: Diagnosis not present

## 2016-12-20 DIAGNOSIS — I10 Essential (primary) hypertension: Secondary | ICD-10-CM | POA: Diagnosis not present

## 2016-12-20 DIAGNOSIS — E7849 Other hyperlipidemia: Secondary | ICD-10-CM | POA: Diagnosis not present

## 2016-12-20 DIAGNOSIS — Z6832 Body mass index (BMI) 32.0-32.9, adult: Secondary | ICD-10-CM | POA: Diagnosis not present

## 2016-12-20 DIAGNOSIS — R7301 Impaired fasting glucose: Secondary | ICD-10-CM | POA: Diagnosis not present

## 2017-04-02 DIAGNOSIS — Z1231 Encounter for screening mammogram for malignant neoplasm of breast: Secondary | ICD-10-CM | POA: Diagnosis not present

## 2017-05-25 DIAGNOSIS — L814 Other melanin hyperpigmentation: Secondary | ICD-10-CM | POA: Diagnosis not present

## 2017-05-25 DIAGNOSIS — Z85828 Personal history of other malignant neoplasm of skin: Secondary | ICD-10-CM | POA: Diagnosis not present

## 2017-05-25 DIAGNOSIS — L821 Other seborrheic keratosis: Secondary | ICD-10-CM | POA: Diagnosis not present

## 2017-05-25 DIAGNOSIS — Z85820 Personal history of malignant melanoma of skin: Secondary | ICD-10-CM | POA: Diagnosis not present

## 2017-05-25 DIAGNOSIS — C4441 Basal cell carcinoma of skin of scalp and neck: Secondary | ICD-10-CM | POA: Diagnosis not present

## 2017-06-21 DIAGNOSIS — H52223 Regular astigmatism, bilateral: Secondary | ICD-10-CM | POA: Diagnosis not present

## 2017-06-21 DIAGNOSIS — H1859 Other hereditary corneal dystrophies: Secondary | ICD-10-CM | POA: Diagnosis not present

## 2017-06-21 DIAGNOSIS — H524 Presbyopia: Secondary | ICD-10-CM | POA: Diagnosis not present

## 2017-06-21 DIAGNOSIS — H04123 Dry eye syndrome of bilateral lacrimal glands: Secondary | ICD-10-CM | POA: Diagnosis not present

## 2017-06-21 DIAGNOSIS — Z961 Presence of intraocular lens: Secondary | ICD-10-CM | POA: Diagnosis not present

## 2017-06-21 DIAGNOSIS — H353133 Nonexudative age-related macular degeneration, bilateral, advanced atrophic without subfoveal involvement: Secondary | ICD-10-CM | POA: Diagnosis not present

## 2017-06-21 DIAGNOSIS — H5213 Myopia, bilateral: Secondary | ICD-10-CM | POA: Diagnosis not present

## 2017-08-16 DIAGNOSIS — H3562 Retinal hemorrhage, left eye: Secondary | ICD-10-CM | POA: Diagnosis not present

## 2017-08-16 DIAGNOSIS — H43813 Vitreous degeneration, bilateral: Secondary | ICD-10-CM | POA: Diagnosis not present

## 2017-08-16 DIAGNOSIS — H353231 Exudative age-related macular degeneration, bilateral, with active choroidal neovascularization: Secondary | ICD-10-CM | POA: Diagnosis not present

## 2017-08-20 DIAGNOSIS — I10 Essential (primary) hypertension: Secondary | ICD-10-CM | POA: Diagnosis not present

## 2017-08-20 DIAGNOSIS — R7301 Impaired fasting glucose: Secondary | ICD-10-CM | POA: Diagnosis not present

## 2017-08-20 DIAGNOSIS — M109 Gout, unspecified: Secondary | ICD-10-CM | POA: Diagnosis not present

## 2017-08-20 DIAGNOSIS — E7849 Other hyperlipidemia: Secondary | ICD-10-CM | POA: Diagnosis not present

## 2017-08-20 DIAGNOSIS — M810 Age-related osteoporosis without current pathological fracture: Secondary | ICD-10-CM | POA: Diagnosis not present

## 2017-08-20 DIAGNOSIS — R82998 Other abnormal findings in urine: Secondary | ICD-10-CM | POA: Diagnosis not present

## 2017-08-27 DIAGNOSIS — I10 Essential (primary) hypertension: Secondary | ICD-10-CM | POA: Diagnosis not present

## 2017-08-27 DIAGNOSIS — Z6832 Body mass index (BMI) 32.0-32.9, adult: Secondary | ICD-10-CM | POA: Diagnosis not present

## 2017-08-27 DIAGNOSIS — Z0000 Encounter for general adult medical examination without abnormal findings: Secondary | ICD-10-CM | POA: Diagnosis not present

## 2017-08-27 DIAGNOSIS — M545 Low back pain: Secondary | ICD-10-CM | POA: Diagnosis not present

## 2017-08-27 DIAGNOSIS — M810 Age-related osteoporosis without current pathological fracture: Secondary | ICD-10-CM | POA: Diagnosis not present

## 2017-08-27 DIAGNOSIS — R7301 Impaired fasting glucose: Secondary | ICD-10-CM | POA: Diagnosis not present

## 2017-08-27 DIAGNOSIS — M25512 Pain in left shoulder: Secondary | ICD-10-CM | POA: Diagnosis not present

## 2017-08-27 DIAGNOSIS — Z1389 Encounter for screening for other disorder: Secondary | ICD-10-CM | POA: Diagnosis not present

## 2017-08-27 DIAGNOSIS — M25571 Pain in right ankle and joints of right foot: Secondary | ICD-10-CM | POA: Diagnosis not present

## 2017-08-27 DIAGNOSIS — E668 Other obesity: Secondary | ICD-10-CM | POA: Diagnosis not present

## 2017-08-27 DIAGNOSIS — M109 Gout, unspecified: Secondary | ICD-10-CM | POA: Diagnosis not present

## 2017-08-27 DIAGNOSIS — E7849 Other hyperlipidemia: Secondary | ICD-10-CM | POA: Diagnosis not present

## 2017-08-31 DIAGNOSIS — Z1212 Encounter for screening for malignant neoplasm of rectum: Secondary | ICD-10-CM | POA: Diagnosis not present

## 2017-09-14 DIAGNOSIS — H353231 Exudative age-related macular degeneration, bilateral, with active choroidal neovascularization: Secondary | ICD-10-CM | POA: Diagnosis not present

## 2017-10-18 DIAGNOSIS — H353231 Exudative age-related macular degeneration, bilateral, with active choroidal neovascularization: Secondary | ICD-10-CM | POA: Diagnosis not present

## 2017-11-16 DIAGNOSIS — Z23 Encounter for immunization: Secondary | ICD-10-CM | POA: Diagnosis not present

## 2017-11-22 DIAGNOSIS — H353231 Exudative age-related macular degeneration, bilateral, with active choroidal neovascularization: Secondary | ICD-10-CM | POA: Diagnosis not present

## 2017-11-22 DIAGNOSIS — H43813 Vitreous degeneration, bilateral: Secondary | ICD-10-CM | POA: Diagnosis not present

## 2017-12-27 DIAGNOSIS — H43813 Vitreous degeneration, bilateral: Secondary | ICD-10-CM | POA: Diagnosis not present

## 2017-12-27 DIAGNOSIS — H353231 Exudative age-related macular degeneration, bilateral, with active choroidal neovascularization: Secondary | ICD-10-CM | POA: Diagnosis not present

## 2017-12-27 DIAGNOSIS — H3562 Retinal hemorrhage, left eye: Secondary | ICD-10-CM | POA: Diagnosis not present

## 2018-01-23 DIAGNOSIS — H53413 Scotoma involving central area, bilateral: Secondary | ICD-10-CM | POA: Diagnosis not present

## 2018-02-07 DIAGNOSIS — H43813 Vitreous degeneration, bilateral: Secondary | ICD-10-CM | POA: Diagnosis not present

## 2018-02-07 DIAGNOSIS — H3562 Retinal hemorrhage, left eye: Secondary | ICD-10-CM | POA: Diagnosis not present

## 2018-02-07 DIAGNOSIS — H353231 Exudative age-related macular degeneration, bilateral, with active choroidal neovascularization: Secondary | ICD-10-CM | POA: Diagnosis not present

## 2018-02-13 DIAGNOSIS — H53413 Scotoma involving central area, bilateral: Secondary | ICD-10-CM | POA: Diagnosis not present

## 2018-02-25 DIAGNOSIS — M545 Low back pain: Secondary | ICD-10-CM | POA: Diagnosis not present

## 2018-02-25 DIAGNOSIS — I10 Essential (primary) hypertension: Secondary | ICD-10-CM | POA: Diagnosis not present

## 2018-02-25 DIAGNOSIS — Z6832 Body mass index (BMI) 32.0-32.9, adult: Secondary | ICD-10-CM | POA: Diagnosis not present

## 2018-02-25 DIAGNOSIS — M810 Age-related osteoporosis without current pathological fracture: Secondary | ICD-10-CM | POA: Diagnosis not present

## 2018-02-25 DIAGNOSIS — R7301 Impaired fasting glucose: Secondary | ICD-10-CM | POA: Diagnosis not present

## 2018-03-06 ENCOUNTER — Ambulatory Visit (HOSPITAL_COMMUNITY)
Admission: RE | Admit: 2018-03-06 | Discharge: 2018-03-06 | Disposition: A | Discharge status: Home / Self Care | Payer: Medicare Other | Source: Ambulatory Visit | Attending: Internal Medicine | Admitting: Internal Medicine

## 2018-03-06 DIAGNOSIS — M810 Age-related osteoporosis without current pathological fracture: Secondary | ICD-10-CM | POA: Diagnosis not present

## 2018-03-06 MED ORDER — DENOSUMAB 60 MG/ML ~~LOC~~ SOSY
PREFILLED_SYRINGE | SUBCUTANEOUS | Status: AC
  Filled 2018-03-06: qty 1

## 2018-03-06 MED ORDER — DENOSUMAB 60 MG/ML ~~LOC~~ SOSY
60.0000 mg | PREFILLED_SYRINGE | Freq: Once | SUBCUTANEOUS | Status: AC
  Administered 2018-03-06: 60 mg via SUBCUTANEOUS

## 2018-03-06 NOTE — Discharge Instructions (Signed)
Denosumab injection °What is this medicine? °DENOSUMAB (den oh sue mab) slows bone breakdown. Prolia is used to treat osteoporosis in women after menopause and in men, and in people who are taking corticosteroids for 6 months or more. Xgeva is used to treat a high calcium level due to cancer and to prevent bone fractures and other bone problems caused by multiple myeloma or cancer bone metastases. Xgeva is also used to treat giant cell tumor of the bone. °This medicine may be used for other purposes; ask your health care provider or pharmacist if you have questions. °COMMON BRAND NAME(S): Prolia, XGEVA °What should I tell my health care provider before I take this medicine? °They need to know if you have any of these conditions: °-dental disease °-having surgery or tooth extraction °-infection °-kidney disease °-low levels of calcium or Vitamin D in the blood °-malnutrition °-on hemodialysis °-skin conditions or sensitivity °-thyroid or parathyroid disease °-an unusual reaction to denosumab, other medicines, foods, dyes, or preservatives °-pregnant or trying to get pregnant °-breast-feeding °How should I use this medicine? °This medicine is for injection under the skin. It is given by a health care professional in a hospital or clinic setting. °A special MedGuide will be given to you before each treatment. Be sure to read this information carefully each time. °For Prolia, talk to your pediatrician regarding the use of this medicine in children. Special care may be needed. For Xgeva, talk to your pediatrician regarding the use of this medicine in children. While this drug may be prescribed for children as young as 13 years for selected conditions, precautions do apply. °Overdosage: If you think you have taken too much of this medicine contact a poison control center or emergency room at once. °NOTE: This medicine is only for you. Do not share this medicine with others. °What if I miss a dose? °It is important not to  miss your dose. Call your doctor or health care professional if you are unable to keep an appointment. °What may interact with this medicine? °Do not take this medicine with any of the following medications: °-other medicines containing denosumab °This medicine may also interact with the following medications: °-medicines that lower your chance of fighting infection °-steroid medicines like prednisone or cortisone °This list may not describe all possible interactions. Give your health care provider a list of all the medicines, herbs, non-prescription drugs, or dietary supplements you use. Also tell them if you smoke, drink alcohol, or use illegal drugs. Some items may interact with your medicine. °What should I watch for while using this medicine? °Visit your doctor or health care professional for regular checks on your progress. Your doctor or health care professional may order blood tests and other tests to see how you are doing. °Call your doctor or health care professional for advice if you get a fever, chills or sore throat, or other symptoms of a cold or flu. Do not treat yourself. This drug may decrease your body's ability to fight infection. Try to avoid being around people who are sick. °You should make sure you get enough calcium and vitamin D while you are taking this medicine, unless your doctor tells you not to. Discuss the foods you eat and the vitamins you take with your health care professional. °See your dentist regularly. Brush and floss your teeth as directed. Before you have any dental work done, tell your dentist you are receiving this medicine. °Do not become pregnant while taking this medicine or for 5 months   after stopping it. Talk with your doctor or health care professional about your birth control options while taking this medicine. Women should inform their doctor if they wish to become pregnant or think they might be pregnant. There is a potential for serious side effects to an unborn  child. Talk to your health care professional or pharmacist for more information. °What side effects may I notice from receiving this medicine? °Side effects that you should report to your doctor or health care professional as soon as possible: °-allergic reactions like skin rash, itching or hives, swelling of the face, lips, or tongue °-bone pain °-breathing problems °-dizziness °-jaw pain, especially after dental work °-redness, blistering, peeling of the skin °-signs and symptoms of infection like fever or chills; cough; sore throat; pain or trouble passing urine °-signs of low calcium like fast heartbeat, muscle cramps or muscle pain; pain, tingling, numbness in the hands or feet; seizures °-unusual bleeding or bruising °-unusually weak or tired °Side effects that usually do not require medical attention (report to your doctor or health care professional if they continue or are bothersome): °-constipation °-diarrhea °-headache °-joint pain °-loss of appetite °-muscle pain °-runny nose °-tiredness °-upset stomach °This list may not describe all possible side effects. Call your doctor for medical advice about side effects. You may report side effects to FDA at 1-800-FDA-1088. °Where should I keep my medicine? °This medicine is only given in a clinic, doctor's office, or other health care setting and will not be stored at home. °NOTE: This sheet is a summary. It may not cover all possible information. If you have questions about this medicine, talk to your doctor, pharmacist, or health care provider. °© 2019 [employer]vier/Gold Standard (2017-05-11 16:10:44) ° °

## 2018-03-19 ENCOUNTER — Emergency Department (INDEPENDENT_AMBULATORY_CARE_PROVIDER_SITE_OTHER): Payer: Medicare Other

## 2018-03-19 ENCOUNTER — Emergency Department (INDEPENDENT_AMBULATORY_CARE_PROVIDER_SITE_OTHER)
Admission: EM | Admit: 2018-03-19 | Discharge: 2018-03-19 | Disposition: A | Discharge status: Home / Self Care | Payer: Medicare Other | Source: Home / Self Care

## 2018-03-19 ENCOUNTER — Other Ambulatory Visit: Payer: Self-pay

## 2018-03-19 ENCOUNTER — Encounter: Payer: Self-pay | Admitting: Emergency Medicine

## 2018-03-19 DIAGNOSIS — M25532 Pain in left wrist: Secondary | ICD-10-CM | POA: Diagnosis not present

## 2018-03-19 DIAGNOSIS — M79642 Pain in left hand: Secondary | ICD-10-CM

## 2018-03-19 DIAGNOSIS — S52502A Unspecified fracture of the lower end of left radius, initial encounter for closed fracture: Secondary | ICD-10-CM

## 2018-03-19 DIAGNOSIS — S0083XA Contusion of other part of head, initial encounter: Secondary | ICD-10-CM | POA: Diagnosis not present

## 2018-03-19 DIAGNOSIS — S0003XA Contusion of scalp, initial encounter: Secondary | ICD-10-CM

## 2018-03-19 DIAGNOSIS — W19XXXA Unspecified fall, initial encounter: Secondary | ICD-10-CM | POA: Diagnosis not present

## 2018-03-19 DIAGNOSIS — S0990XA Unspecified injury of head, initial encounter: Secondary | ICD-10-CM | POA: Diagnosis not present

## 2018-03-19 DIAGNOSIS — S6992XA Unspecified injury of left wrist, hand and finger(s), initial encounter: Secondary | ICD-10-CM | POA: Diagnosis not present

## 2018-03-19 DIAGNOSIS — W010XXA Fall on same level from slipping, tripping and stumbling without subsequent striking against object, initial encounter: Secondary | ICD-10-CM

## 2018-03-19 HISTORY — DX: Essential (primary) hypertension: I10

## 2018-03-19 HISTORY — DX: Age-related osteoporosis without current pathological fracture: M81.0

## 2018-03-19 NOTE — ED Provider Notes (Signed)
[name]    CSN: [id number] Arrival date & time: 03/19/18  1657     History   Chief Complaint Chief Complaint  Patient presents with  . Fall    HPI [name] is a 85 y.o. female.   HPI  [name] is a 85 y.o. female presenting to UC with daughter with c/o Left wrist pain that started around 3:30PM when pt tripped on a cub and fell onto a muddy ground.  She did hit her head but no LOC. Pt has a scrape on the Left side of her forehead, denies HA, nausea, dizziness. Per daughter, pt has been acting normal.  No pain medication taken PTA.  Pt denies neck, back or hip pain. Her main concern is the wrist pain that is aching and sore, worse with movement.    Past Medical History:  Diagnosis Date  . Hypertension   . Osteoporosis     Patient Active Problem List   Diagnosis Date Noted  . ABDOMINAL PAIN, RIGHT UPPER QUADRANT 10/29/2008  . HYPOTHYROIDISM 12/25/2006  . HYPERLIPIDEMIA 12/25/2006  . ALLERGIC RHINITIS 12/25/2006  . URINARY INCONTINENCE 12/25/2006  . POSITIVE PPD 12/25/2006    History reviewed. No pertinent surgical history.  OB History   No obstetric history on file.      Home Medications    Prior to Admission medications   Not on File    Family History History reviewed. No pertinent family history.  Social History Social History   Tobacco Use  . Smoking status: Never Smoker  . Smokeless tobacco: Never Used  Substance Use Topics  . Alcohol use: Not Currently  . Drug use: Never     Allergies   Patient has no known allergies.   Review of Systems Review of Systems  Musculoskeletal: Positive for arthralgias and joint swelling. Negative for myalgias.  Skin: Positive for color change and wound.  Neurological: Negative for dizziness, light-headedness and headaches.     Physical Exam Triage Vital Signs ED Triage Vitals  Enc Vitals Group     BP      Pulse      Resp      Temp      Temp src      SpO2      Weight     Height      Head Circumference      Peak Flow      Pain Score      Pain Loc      Pain Edu?      Excl. in [place]?    No data found.  Updated Vital Signs BP (!) 151/83 (BP Location: Right Arm)   Pulse 84   Temp (!) 97.4 F (36.3 C) (Oral)   Ht 5\' 4"  (1.626 m)   Wt 170 lb (77.1 kg)   SpO2 96%   BMI 29.18 kg/m   Visual Acuity Right Eye Distance:   Left Eye Distance:   Bilateral Distance:    Right Eye Near:   Left Eye Near:    Bilateral Near:     Physical Exam Vitals signs and nursing note reviewed.  Constitutional:      Appearance: Normal appearance. She is well-developed.  HENT:     Head: Normocephalic. Abrasion and contusion present.     Jaw: There is normal jaw occlusion. No trismus or pain on movement.      Right Ear: Tympanic membrane normal.     Left Ear: Tympanic membrane normal.  Nose: Nose normal.     Right Sinus: No maxillary sinus tenderness or frontal sinus tenderness.     Left Sinus: No maxillary sinus tenderness or frontal sinus tenderness.     Mouth/Throat:     Lips: Pink.     Mouth: Mucous membranes are moist.     Pharynx: Oropharynx is clear. Uvula midline.  Neck:     Musculoskeletal: Normal range of motion.  Cardiovascular:     Rate and Rhythm: Normal rate and regular rhythm.     Pulses:          Radial pulses are 2+ on the left side.  Pulmonary:     Effort: Pulmonary effort is normal. No respiratory distress.     Breath sounds: Normal breath sounds.  Musculoskeletal: Normal range of motion.        General: Swelling and tenderness present.     Comments: Left forearm: mild edema, tenderness to wrist. Full ROM 5/5 grip strength  Full ROM elbow and shoulder w/o tenderness.  Skin:    General: Skin is warm and dry.     Capillary Refill: Capillary refill takes less than 2 seconds.  Neurological:     Mental Status: She is alert and oriented to person, place, and time.  Psychiatric:        Behavior: Behavior normal.      UC Treatments /  Results  Labs (all labs ordered are listed, but only abnormal results are displayed) Labs Reviewed - No data to display  EKG None  Radiology Dg Wrist Complete Left  Result Date: 03/19/2018 CLINICAL DATA:  Fall this afternoon with left hand and wrist pain. EXAM: LEFT WRIST - COMPLETE 3+ VIEW COMPARISON:  None. FINDINGS: Mild degenerative change over the radial side of the carpal bones and first carpometacarpal joint. Subtle lucency with associated cortical irregularity over the distal radial metaphysis suggesting a nondisplaced fracture. IMPRESSION: Possible nondisplaced distal radial metaphyseal fracture. Electronically Signed   By: Marin Olp M.D.   On: 03/19/2018 18:39   Ct Head Wo Contrast  Result Date: 03/19/2018 CLINICAL DATA:  Left forehead knot after fall this afternoon. Headache. EXAM: CT HEAD WITHOUT CONTRAST TECHNIQUE: Contiguous axial images were obtained from the base of the skull through the vertex without intravenous contrast. COMPARISON:  None. FINDINGS: BRAIN: There is mild sulcal and ventricular prominence consistent with age related involutional changes of the brain. No intraparenchymal hemorrhage, mass effect nor midline shift. Periventricular and subcortical white matter hypodensities consistent with chronic small vessel ischemic disease are identified. No acute large vascular territory infarcts. No abnormal extra-axial fluid collections. Basal cisterns are not effaced and midline. VASCULAR: Mild-to-moderate calcific atherosclerosis of the carotid siphons. SKULL: No skull fracture.  No suspicious osseous lesions. SINUSES/ORBITS: The mastoid air-cells are clear. The included paranasal sinuses are well-aerated.The included ocular globes and orbital contents are non-suspicious. OTHER: Mild left frontoparietal soft tissue swelling/contusion. IMPRESSION: 1. Age related involutional changes of the brain with chronic microvascular ischemic disease. No acute intracranial abnormality. 2.  Mild left frontoparietal scalp contusion without underlying fracture. Electronically Signed   By: Ashley Royalty M.D.   On: 03/19/2018 18:41   Dg Hand Complete Left  Result Date: 03/19/2018 CLINICAL DATA:  Fall this afternoon with left hand pain. EXAM: LEFT HAND - COMPLETE 3+ VIEW COMPARISON:  None. FINDINGS: Mild degenerate change over the radial side of the carpal bones as well as first carpometacarpal joint. Degenerative change over the first MCP joint as well as degenerative changes of the interphalangeal  joints. Subtle transverse lucency with associated cortical irregularity over the distal radial metaphysis suggesting a nondisplaced fracture. IMPRESSION: Suggestion of a nondisplaced transverse fracture of the distal radial metaphysis. Electronically Signed   By: Marin Olp M.D.   On: 03/19/2018 18:44    Procedures Splint Application Date/Time: 01/20/7827 7:58 PM Performed by: Noe Gens, PA-C Authorized by: Noe Gens, PA-C   Consent:    Consent obtained:  Verbal   Consent given by:  Patient   Risks discussed:  Discoloration, numbness, pain and swelling   Alternatives discussed:  No treatment and delayed treatment Pre-procedure details:    Sensation:  Normal Procedure details:    Laterality:  Left   Location:  Wrist   Wrist:  L wrist   Strapping: no     Cast type:  Long arm   Splint type:  Sugar tong   Supplies:  Ortho-Glass, elastic bandage and sling Post-procedure details:    Pain:  Unchanged   Sensation:  Normal   Patient tolerance of procedure:  Tolerated well, no immediate complications   (including critical care time)  Medications Ordered in UC Medications - No data to display  Initial Impression / Assessment and Plan / UC Course  I have reviewed the triage vital signs and the nursing notes.  Pertinent labs & imaging results that were available during my care of the patient were reviewed by me and considered in my medical decision making (see chart for  details).     Reviewed imaging with pt and daughter Left wrist splinted as noted above Home care info with AVS provided Discussed symptoms that warrant emergent care in the ED.  Final Clinical Impressions(s) / UC Diagnoses   Final diagnoses:  Closed fracture of distal end of left radius, unspecified fracture morphology, initial encounter  Fall from slip, trip, or stumble, initial encounter  Facial contusion, initial encounter  Injury of head, initial encounter     Discharge Instructions      Please call your family doctor tomorrow to schedule a recheck of your Left arm by the end of the week. He will likely want to change your splint, or he may refer you to an orthopedist or sports medicine provider.  Your head scan this evening was normal, however, if you develop a worsening headache, dizziness, nausea, change in vision or other new concerning symptoms develop, please call 911 or have someone drive you to the closest hospital.      ED Prescriptions    None     Controlled Substance Prescriptions [place] Controlled Substance Registry consulted? Not Applicable   Tyrell Antonio 03/19/18 [id number]

## 2018-03-19 NOTE — Discharge Instructions (Addendum)
°  Please call your family doctor tomorrow to schedule a recheck of your Left arm by the end of the week. He will likely want to change your splint, or he may refer you to an orthopedist or sports medicine provider.  Your head scan this evening was normal, however, if you develop a worsening headache, dizziness, nausea, change in vision or other new concerning symptoms develop, please call 911 or have someone drive you to the closest hospital.

## 2018-03-19 NOTE — ED Triage Notes (Signed)
Patient missed a step and went down into mud injuring LT wrist and forearm, Hit her head over LT eye.

## 2018-03-21 ENCOUNTER — Telehealth: Payer: Self-pay | Admitting: Emergency Medicine

## 2018-03-21 DIAGNOSIS — H43813 Vitreous degeneration, bilateral: Secondary | ICD-10-CM | POA: Diagnosis not present

## 2018-03-21 DIAGNOSIS — H3562 Retinal hemorrhage, left eye: Secondary | ICD-10-CM | POA: Diagnosis not present

## 2018-03-21 DIAGNOSIS — H353231 Exudative age-related macular degeneration, bilateral, with active choroidal neovascularization: Secondary | ICD-10-CM | POA: Diagnosis not present

## 2018-03-26 DIAGNOSIS — F439 Reaction to severe stress, unspecified: Secondary | ICD-10-CM | POA: Diagnosis not present

## 2018-03-26 DIAGNOSIS — R002 Palpitations: Secondary | ICD-10-CM | POA: Diagnosis not present

## 2018-03-26 DIAGNOSIS — S62102A Fracture of unspecified carpal bone, left wrist, initial encounter for closed fracture: Secondary | ICD-10-CM | POA: Diagnosis not present

## 2018-03-26 DIAGNOSIS — W101XXA Fall (on)(from) sidewalk curb, initial encounter: Secondary | ICD-10-CM | POA: Diagnosis not present

## 2018-03-26 DIAGNOSIS — I10 Essential (primary) hypertension: Secondary | ICD-10-CM | POA: Diagnosis not present

## 2018-03-26 DIAGNOSIS — Z6832 Body mass index (BMI) 32.0-32.9, adult: Secondary | ICD-10-CM | POA: Diagnosis not present

## 2018-03-27 DIAGNOSIS — M25532 Pain in left wrist: Secondary | ICD-10-CM | POA: Diagnosis not present

## 2018-04-09 DIAGNOSIS — I10 Essential (primary) hypertension: Secondary | ICD-10-CM | POA: Diagnosis not present

## 2018-04-09 DIAGNOSIS — R002 Palpitations: Secondary | ICD-10-CM | POA: Diagnosis not present

## 2018-04-10 ENCOUNTER — Other Ambulatory Visit: Payer: Self-pay | Admitting: Internal Medicine

## 2018-04-10 ENCOUNTER — Telehealth: Payer: Self-pay | Admitting: *Deleted

## 2018-04-10 DIAGNOSIS — R002 Palpitations: Secondary | ICD-10-CM

## 2018-04-10 NOTE — Telephone Encounter (Signed)
Patient was called to offer having 7 day cardiac event monitor shipped to her home directly from [facility].  Patient declined, stating she has some limitations due to age and vision problem.  She felt more comfortable having monitor applied in office.  Patient has been scheduled on anticipated offset of COVID-19 restrictions, Jun 03, 2018.

## 2018-05-20 DIAGNOSIS — H353231 Exudative age-related macular degeneration, bilateral, with active choroidal neovascularization: Secondary | ICD-10-CM | POA: Diagnosis not present

## 2018-07-25 DIAGNOSIS — H35373 Puckering of macula, bilateral: Secondary | ICD-10-CM | POA: Diagnosis not present

## 2018-07-25 DIAGNOSIS — H353231 Exudative age-related macular degeneration, bilateral, with active choroidal neovascularization: Secondary | ICD-10-CM | POA: Diagnosis not present

## 2018-07-25 DIAGNOSIS — H43813 Vitreous degeneration, bilateral: Secondary | ICD-10-CM | POA: Diagnosis not present

## 2018-09-06 DIAGNOSIS — I10 Essential (primary) hypertension: Secondary | ICD-10-CM | POA: Diagnosis not present

## 2018-09-06 DIAGNOSIS — R002 Palpitations: Secondary | ICD-10-CM | POA: Diagnosis not present

## 2018-09-18 DIAGNOSIS — I10 Essential (primary) hypertension: Secondary | ICD-10-CM | POA: Diagnosis not present

## 2018-09-24 DIAGNOSIS — M109 Gout, unspecified: Secondary | ICD-10-CM | POA: Diagnosis not present

## 2018-09-24 DIAGNOSIS — M810 Age-related osteoporosis without current pathological fracture: Secondary | ICD-10-CM | POA: Diagnosis not present

## 2018-09-24 DIAGNOSIS — E7849 Other hyperlipidemia: Secondary | ICD-10-CM | POA: Diagnosis not present

## 2018-09-24 DIAGNOSIS — R82998 Other abnormal findings in urine: Secondary | ICD-10-CM | POA: Diagnosis not present

## 2018-09-24 DIAGNOSIS — I10 Essential (primary) hypertension: Secondary | ICD-10-CM | POA: Diagnosis not present

## 2018-09-24 DIAGNOSIS — R7301 Impaired fasting glucose: Secondary | ICD-10-CM | POA: Diagnosis not present

## 2018-09-26 DIAGNOSIS — I10 Essential (primary) hypertension: Secondary | ICD-10-CM | POA: Diagnosis not present

## 2018-09-30 DIAGNOSIS — E785 Hyperlipidemia, unspecified: Secondary | ICD-10-CM | POA: Diagnosis not present

## 2018-09-30 DIAGNOSIS — Z0000 Encounter for general adult medical examination without abnormal findings: Secondary | ICD-10-CM | POA: Diagnosis not present

## 2018-09-30 DIAGNOSIS — M25571 Pain in right ankle and joints of right foot: Secondary | ICD-10-CM | POA: Diagnosis not present

## 2018-09-30 DIAGNOSIS — I10 Essential (primary) hypertension: Secondary | ICD-10-CM | POA: Diagnosis not present

## 2018-09-30 DIAGNOSIS — Z1331 Encounter for screening for depression: Secondary | ICD-10-CM | POA: Diagnosis not present

## 2018-09-30 DIAGNOSIS — R030 Elevated blood-pressure reading, without diagnosis of hypertension: Secondary | ICD-10-CM | POA: Diagnosis not present

## 2018-09-30 DIAGNOSIS — R002 Palpitations: Secondary | ICD-10-CM | POA: Diagnosis not present

## 2018-09-30 DIAGNOSIS — W101XXS Fall (on)(from) sidewalk curb, sequela: Secondary | ICD-10-CM | POA: Diagnosis not present

## 2018-09-30 DIAGNOSIS — F439 Reaction to severe stress, unspecified: Secondary | ICD-10-CM | POA: Diagnosis not present

## 2018-09-30 DIAGNOSIS — E669 Obesity, unspecified: Secondary | ICD-10-CM | POA: Diagnosis not present

## 2018-09-30 DIAGNOSIS — M109 Gout, unspecified: Secondary | ICD-10-CM | POA: Diagnosis not present

## 2018-09-30 DIAGNOSIS — R7301 Impaired fasting glucose: Secondary | ICD-10-CM | POA: Diagnosis not present

## 2018-10-03 DIAGNOSIS — Z23 Encounter for immunization: Secondary | ICD-10-CM | POA: Diagnosis not present

## 2018-10-03 DIAGNOSIS — H35373 Puckering of macula, bilateral: Secondary | ICD-10-CM | POA: Diagnosis not present

## 2018-10-03 DIAGNOSIS — H43813 Vitreous degeneration, bilateral: Secondary | ICD-10-CM | POA: Diagnosis not present

## 2018-10-03 DIAGNOSIS — H353231 Exudative age-related macular degeneration, bilateral, with active choroidal neovascularization: Secondary | ICD-10-CM | POA: Diagnosis not present

## 2018-10-16 ENCOUNTER — Encounter (HOSPITAL_COMMUNITY)
Admission: RE | Admit: 2018-10-16 | Discharge: 2018-10-16 | Disposition: A | Discharge status: Home / Self Care | Payer: Medicare Other | Source: Ambulatory Visit | Attending: Internal Medicine | Admitting: Internal Medicine

## 2018-10-16 ENCOUNTER — Other Ambulatory Visit (HOSPITAL_COMMUNITY): Payer: Self-pay | Admitting: *Deleted

## 2018-10-16 ENCOUNTER — Other Ambulatory Visit: Payer: Self-pay

## 2018-10-16 DIAGNOSIS — M810 Age-related osteoporosis without current pathological fracture: Secondary | ICD-10-CM | POA: Diagnosis not present

## 2018-10-16 MED ORDER — DENOSUMAB 60 MG/ML ~~LOC~~ SOSY
PREFILLED_SYRINGE | SUBCUTANEOUS | Status: AC
  Filled 2018-10-16: qty 1

## 2018-10-16 MED ORDER — DENOSUMAB 60 MG/ML ~~LOC~~ SOSY
60.0000 mg | PREFILLED_SYRINGE | Freq: Once | SUBCUTANEOUS | Status: AC
  Administered 2018-10-16: 14:00:00 60 mg via SUBCUTANEOUS

## 2018-11-05 DIAGNOSIS — Z85828 Personal history of other malignant neoplasm of skin: Secondary | ICD-10-CM | POA: Diagnosis not present

## 2018-11-05 DIAGNOSIS — C44319 Basal cell carcinoma of skin of other parts of face: Secondary | ICD-10-CM | POA: Diagnosis not present

## 2018-11-05 DIAGNOSIS — D225 Melanocytic nevi of trunk: Secondary | ICD-10-CM | POA: Diagnosis not present

## 2018-11-05 DIAGNOSIS — Z85820 Personal history of malignant melanoma of skin: Secondary | ICD-10-CM | POA: Diagnosis not present

## 2018-11-05 DIAGNOSIS — L821 Other seborrheic keratosis: Secondary | ICD-10-CM | POA: Diagnosis not present

## 2018-12-26 DIAGNOSIS — H353231 Exudative age-related macular degeneration, bilateral, with active choroidal neovascularization: Secondary | ICD-10-CM | POA: Diagnosis not present

## 2018-12-26 DIAGNOSIS — H353221 Exudative age-related macular degeneration, left eye, with active choroidal neovascularization: Secondary | ICD-10-CM | POA: Diagnosis not present

## 2019-01-20 DIAGNOSIS — Z23 Encounter for immunization: Secondary | ICD-10-CM | POA: Diagnosis not present

## 2019-02-03 DIAGNOSIS — R030 Elevated blood-pressure reading, without diagnosis of hypertension: Secondary | ICD-10-CM | POA: Diagnosis not present

## 2019-02-03 DIAGNOSIS — E669 Obesity, unspecified: Secondary | ICD-10-CM | POA: Diagnosis not present

## 2019-02-03 DIAGNOSIS — R7301 Impaired fasting glucose: Secondary | ICD-10-CM | POA: Diagnosis not present

## 2019-02-03 DIAGNOSIS — M810 Age-related osteoporosis without current pathological fracture: Secondary | ICD-10-CM | POA: Diagnosis not present

## 2019-02-03 DIAGNOSIS — E785 Hyperlipidemia, unspecified: Secondary | ICD-10-CM | POA: Diagnosis not present

## 2019-02-03 DIAGNOSIS — I10 Essential (primary) hypertension: Secondary | ICD-10-CM | POA: Diagnosis not present

## 2019-02-06 DIAGNOSIS — Z1212 Encounter for screening for malignant neoplasm of rectum: Secondary | ICD-10-CM | POA: Diagnosis not present

## 2019-02-17 DIAGNOSIS — Z23 Encounter for immunization: Secondary | ICD-10-CM | POA: Diagnosis not present

## 2019-03-04 ENCOUNTER — Other Ambulatory Visit (HOSPITAL_COMMUNITY): Payer: Self-pay | Admitting: *Deleted

## 2019-03-05 ENCOUNTER — Ambulatory Visit (HOSPITAL_COMMUNITY)
Admission: RE | Admit: 2019-03-05 | Discharge: 2019-03-05 | Disposition: A | Discharge status: Home / Self Care | Payer: Medicare Other | Source: Ambulatory Visit | Attending: Internal Medicine | Admitting: Internal Medicine

## 2019-03-05 ENCOUNTER — Other Ambulatory Visit: Payer: Self-pay

## 2019-03-05 DIAGNOSIS — M810 Age-related osteoporosis without current pathological fracture: Secondary | ICD-10-CM | POA: Insufficient documentation

## 2019-03-05 MED ORDER — DENOSUMAB 60 MG/ML ~~LOC~~ SOSY: 60.0000 mg | PREFILLED_SYRINGE | Freq: Once | SUBCUTANEOUS | Status: DC

## 2019-03-05 MED ORDER — DENOSUMAB 60 MG/ML ~~LOC~~ SOSY
PREFILLED_SYRINGE | SUBCUTANEOUS | Status: AC
  Administered 2019-03-05: 60 mg
  Filled 2019-03-05: qty 1

## 2019-03-20 DIAGNOSIS — H43813 Vitreous degeneration, bilateral: Secondary | ICD-10-CM | POA: Diagnosis not present

## 2019-03-20 DIAGNOSIS — H35373 Puckering of macula, bilateral: Secondary | ICD-10-CM | POA: Diagnosis not present

## 2019-03-20 DIAGNOSIS — H353221 Exudative age-related macular degeneration, left eye, with active choroidal neovascularization: Secondary | ICD-10-CM | POA: Diagnosis not present

## 2019-03-20 DIAGNOSIS — H353231 Exudative age-related macular degeneration, bilateral, with active choroidal neovascularization: Secondary | ICD-10-CM | POA: Diagnosis not present

## 2019-05-08 DIAGNOSIS — Z85828 Personal history of other malignant neoplasm of skin: Secondary | ICD-10-CM | POA: Diagnosis not present

## 2019-05-08 DIAGNOSIS — Z85820 Personal history of malignant melanoma of skin: Secondary | ICD-10-CM | POA: Diagnosis not present

## 2019-05-08 DIAGNOSIS — D2272 Melanocytic nevi of left lower limb, including hip: Secondary | ICD-10-CM | POA: Diagnosis not present

## 2019-05-08 DIAGNOSIS — D485 Neoplasm of uncertain behavior of skin: Secondary | ICD-10-CM | POA: Diagnosis not present

## 2019-05-08 DIAGNOSIS — D225 Melanocytic nevi of trunk: Secondary | ICD-10-CM | POA: Diagnosis not present

## 2019-05-08 DIAGNOSIS — L814 Other melanin hyperpigmentation: Secondary | ICD-10-CM | POA: Diagnosis not present

## 2019-05-08 DIAGNOSIS — L821 Other seborrheic keratosis: Secondary | ICD-10-CM | POA: Diagnosis not present

## 2019-06-12 DIAGNOSIS — H35373 Puckering of macula, bilateral: Secondary | ICD-10-CM | POA: Diagnosis not present

## 2019-06-12 DIAGNOSIS — H353221 Exudative age-related macular degeneration, left eye, with active choroidal neovascularization: Secondary | ICD-10-CM | POA: Diagnosis not present

## 2019-06-12 DIAGNOSIS — H43813 Vitreous degeneration, bilateral: Secondary | ICD-10-CM | POA: Diagnosis not present

## 2019-06-12 DIAGNOSIS — H353231 Exudative age-related macular degeneration, bilateral, with active choroidal neovascularization: Secondary | ICD-10-CM | POA: Diagnosis not present

## 2019-09-09 ENCOUNTER — Other Ambulatory Visit (HOSPITAL_COMMUNITY): Payer: Self-pay | Admitting: *Deleted

## 2019-09-10 ENCOUNTER — Ambulatory Visit (HOSPITAL_COMMUNITY)
Admission: RE | Admit: 2019-09-10 | Discharge: 2019-09-10 | Disposition: A | Discharge status: Home / Self Care | Payer: Medicare Other | Source: Ambulatory Visit | Attending: Internal Medicine | Admitting: Internal Medicine

## 2019-09-10 ENCOUNTER — Other Ambulatory Visit: Payer: Self-pay

## 2019-09-10 DIAGNOSIS — M810 Age-related osteoporosis without current pathological fracture: Secondary | ICD-10-CM | POA: Insufficient documentation

## 2019-09-10 MED ORDER — DENOSUMAB 60 MG/ML ~~LOC~~ SOSY: 60.0000 mg | PREFILLED_SYRINGE | Freq: Once | SUBCUTANEOUS | Status: AC

## 2019-09-10 MED ORDER — DENOSUMAB 60 MG/ML ~~LOC~~ SOSY
PREFILLED_SYRINGE | SUBCUTANEOUS | Status: AC
  Administered 2019-09-10: 60 mg via SUBCUTANEOUS
  Filled 2019-09-10: qty 1

## 2019-09-18 DIAGNOSIS — H353231 Exudative age-related macular degeneration, bilateral, with active choroidal neovascularization: Secondary | ICD-10-CM | POA: Diagnosis not present

## 2019-09-18 DIAGNOSIS — H43813 Vitreous degeneration, bilateral: Secondary | ICD-10-CM | POA: Diagnosis not present

## 2019-09-18 DIAGNOSIS — H353221 Exudative age-related macular degeneration, left eye, with active choroidal neovascularization: Secondary | ICD-10-CM | POA: Diagnosis not present

## 2019-09-18 DIAGNOSIS — H43393 Other vitreous opacities, bilateral: Secondary | ICD-10-CM | POA: Diagnosis not present

## 2019-10-07 DIAGNOSIS — Z23 Encounter for immunization: Secondary | ICD-10-CM | POA: Diagnosis not present

## 2019-10-22 DIAGNOSIS — E785 Hyperlipidemia, unspecified: Secondary | ICD-10-CM | POA: Diagnosis not present

## 2019-10-22 DIAGNOSIS — M810 Age-related osteoporosis without current pathological fracture: Secondary | ICD-10-CM | POA: Diagnosis not present

## 2019-10-22 DIAGNOSIS — M109 Gout, unspecified: Secondary | ICD-10-CM | POA: Diagnosis not present

## 2019-10-22 DIAGNOSIS — R7301 Impaired fasting glucose: Secondary | ICD-10-CM | POA: Diagnosis not present

## 2019-10-29 DIAGNOSIS — Z0000 Encounter for general adult medical examination without abnormal findings: Secondary | ICD-10-CM | POA: Diagnosis not present

## 2019-10-29 DIAGNOSIS — R82998 Other abnormal findings in urine: Secondary | ICD-10-CM | POA: Diagnosis not present

## 2019-10-29 DIAGNOSIS — R7301 Impaired fasting glucose: Secondary | ICD-10-CM | POA: Diagnosis not present

## 2019-10-29 DIAGNOSIS — H6123 Impacted cerumen, bilateral: Secondary | ICD-10-CM | POA: Diagnosis not present

## 2019-10-29 DIAGNOSIS — M810 Age-related osteoporosis without current pathological fracture: Secondary | ICD-10-CM | POA: Diagnosis not present

## 2019-10-29 DIAGNOSIS — E669 Obesity, unspecified: Secondary | ICD-10-CM | POA: Diagnosis not present

## 2019-10-29 DIAGNOSIS — E785 Hyperlipidemia, unspecified: Secondary | ICD-10-CM | POA: Diagnosis not present

## 2019-10-29 DIAGNOSIS — I10 Essential (primary) hypertension: Secondary | ICD-10-CM | POA: Diagnosis not present

## 2019-11-07 DIAGNOSIS — Z85820 Personal history of malignant melanoma of skin: Secondary | ICD-10-CM | POA: Diagnosis not present

## 2019-11-07 DIAGNOSIS — L821 Other seborrheic keratosis: Secondary | ICD-10-CM | POA: Diagnosis not present

## 2019-11-07 DIAGNOSIS — D2372 Other benign neoplasm of skin of left lower limb, including hip: Secondary | ICD-10-CM | POA: Diagnosis not present

## 2019-11-07 DIAGNOSIS — L814 Other melanin hyperpigmentation: Secondary | ICD-10-CM | POA: Diagnosis not present

## 2019-11-07 DIAGNOSIS — C44519 Basal cell carcinoma of skin of other part of trunk: Secondary | ICD-10-CM | POA: Diagnosis not present

## 2019-11-07 DIAGNOSIS — Z85828 Personal history of other malignant neoplasm of skin: Secondary | ICD-10-CM | POA: Diagnosis not present

## 2019-11-18 ENCOUNTER — Ambulatory Visit (INDEPENDENT_AMBULATORY_CARE_PROVIDER_SITE_OTHER): Payer: Medicare Other | Admitting: Otolaryngology

## 2019-11-18 ENCOUNTER — Other Ambulatory Visit: Payer: Self-pay

## 2019-11-18 ENCOUNTER — Encounter (INDEPENDENT_AMBULATORY_CARE_PROVIDER_SITE_OTHER): Payer: Self-pay | Admitting: Otolaryngology

## 2019-11-18 VITALS — Temp 97.3°F

## 2019-11-18 DIAGNOSIS — H6123 Impacted cerumen, bilateral: Secondary | ICD-10-CM | POA: Diagnosis not present

## 2019-11-18 DIAGNOSIS — H903 Sensorineural hearing loss, bilateral: Secondary | ICD-10-CM

## 2019-11-18 NOTE — Progress Notes (Signed)
HPI: [name] is a 86 y.o. female who presents is referred by Dr. Joylene Draft For evaluation of wax buildup in her ears with obstructed hearing aids.  Patient has had long history of hearing loss and got a recent hearing aids about a year ago.  She was seen recently by her PCP who noticed a lot of wax in her ear canals and recommended having this cleaned and referred her here..  Past Medical History:  Diagnosis Date  . Hypertension   . Osteoporosis    No past surgical history on file. Social History   Socioeconomic History  . Marital status: Widowed    Spouse name: Not on file  . Number of children: Not on file  . Years of education: Not on file  . Highest education level: Not on file  Occupational History  . Not on file  Tobacco Use  . Smoking status: Never Smoker  . Smokeless tobacco: Never Used  Vaping Use  . Vaping Use: Never used  Substance and Sexual Activity  . Alcohol use: Not Currently  . Drug use: Never  . Sexual activity: Not on file  Other Topics Concern  . Not on file  Social History Narrative  . Not on file   Social Determinants of Health   Financial Resource Strain:   . Difficulty of Paying Living Expenses: Not on file  Food Insecurity:   . Worried About Charity fundraiser in the Last Year: Not on file  . Ran Out of Food in the Last Year: Not on file  Transportation Needs:   . Lack of Transportation (Medical): Not on file  . Lack of Transportation (Non-Medical): Not on file  Physical Activity:   . Days of Exercise per Week: Not on file  . Minutes of Exercise per Session: Not on file  Stress:   . Feeling of Stress : Not on file  Social Connections:   . Frequency of Communication with Friends and Family: Not on file  . Frequency of Social Gatherings with Friends and Family: Not on file  . Attends Religious Services: Not on file  . Active Member of Clubs or Organizations: Not on file  . Attends Archivist Meetings: Not on file  . Marital  Status: Not on file   No family history on file. No Known Allergies Prior to Admission medications   Not on File     Positive ROS: Otherwise negative  All other systems have been reviewed and were otherwise negative with the exception of those mentioned in the HPI and as above.  Physical Exam: Constitutional: Alert, well-appearing, no acute distress Ears: External ears without lesions or tenderness.  She has narrow ear canals bilaterally with a mild amount of wax in both ear canals.  This was cleaned with curettes and suction.  TMs were clear otherwise. Nasal: External nose without lesions. Clear nasal passages Oral: Lips and gums without lesions. Tongue and palate mucosa without lesions. Posterior oropharynx clear. Neck: No palpable adenopathy or masses Respiratory: Breathing comfortably  Skin: No facial/neck lesions or rash noted.  Cerumen impaction removal  Date/Time: 11/18/2019 4:49 PM Performed by: Rozetta Nunnery, MD Authorized by: Rozetta Nunnery, MD   Consent:    Consent obtained:  Verbal   Consent given by:  Patient   Risks discussed:  Pain and bleeding Procedure details:    Location:  L ear and R ear   Procedure type: curette and suction   Post-procedure details:    Inspection:  TM intact and canal normal   Hearing quality:  Improved   Patient tolerance of procedure:  Tolerated well, no immediate complications Comments:     TMs are clear otherwise.    Assessment: Bilateral cerumen buildup in patient with bilateral hearing aids.  Plan: This was cleaned in the office. She will follow-up as needed   Radene Journey, MD   CC:

## 2019-12-01 DIAGNOSIS — Z23 Encounter for immunization: Secondary | ICD-10-CM | POA: Diagnosis not present

## 2019-12-10 ENCOUNTER — Ambulatory Visit (INDEPENDENT_AMBULATORY_CARE_PROVIDER_SITE_OTHER): Payer: Medicare Other | Admitting: Otolaryngology

## 2019-12-25 DIAGNOSIS — H353213 Exudative age-related macular degeneration, right eye, with inactive scar: Secondary | ICD-10-CM | POA: Diagnosis not present

## 2019-12-25 DIAGNOSIS — H353221 Exudative age-related macular degeneration, left eye, with active choroidal neovascularization: Secondary | ICD-10-CM | POA: Diagnosis not present

## 2019-12-25 DIAGNOSIS — H35371 Puckering of macula, right eye: Secondary | ICD-10-CM | POA: Diagnosis not present

## 2019-12-25 DIAGNOSIS — H43813 Vitreous degeneration, bilateral: Secondary | ICD-10-CM | POA: Diagnosis not present

## 2020-02-03 IMAGING — DX DG HAND COMPLETE 3+V*L*
3 series · 3 of 3 positions shown · non-contrast
Comparison: None.

CLINICAL DATA: Fall this afternoon with left hand pain.

EXAM:
LEFT HAND - COMPLETE 3+ VIEW

[hand pa]
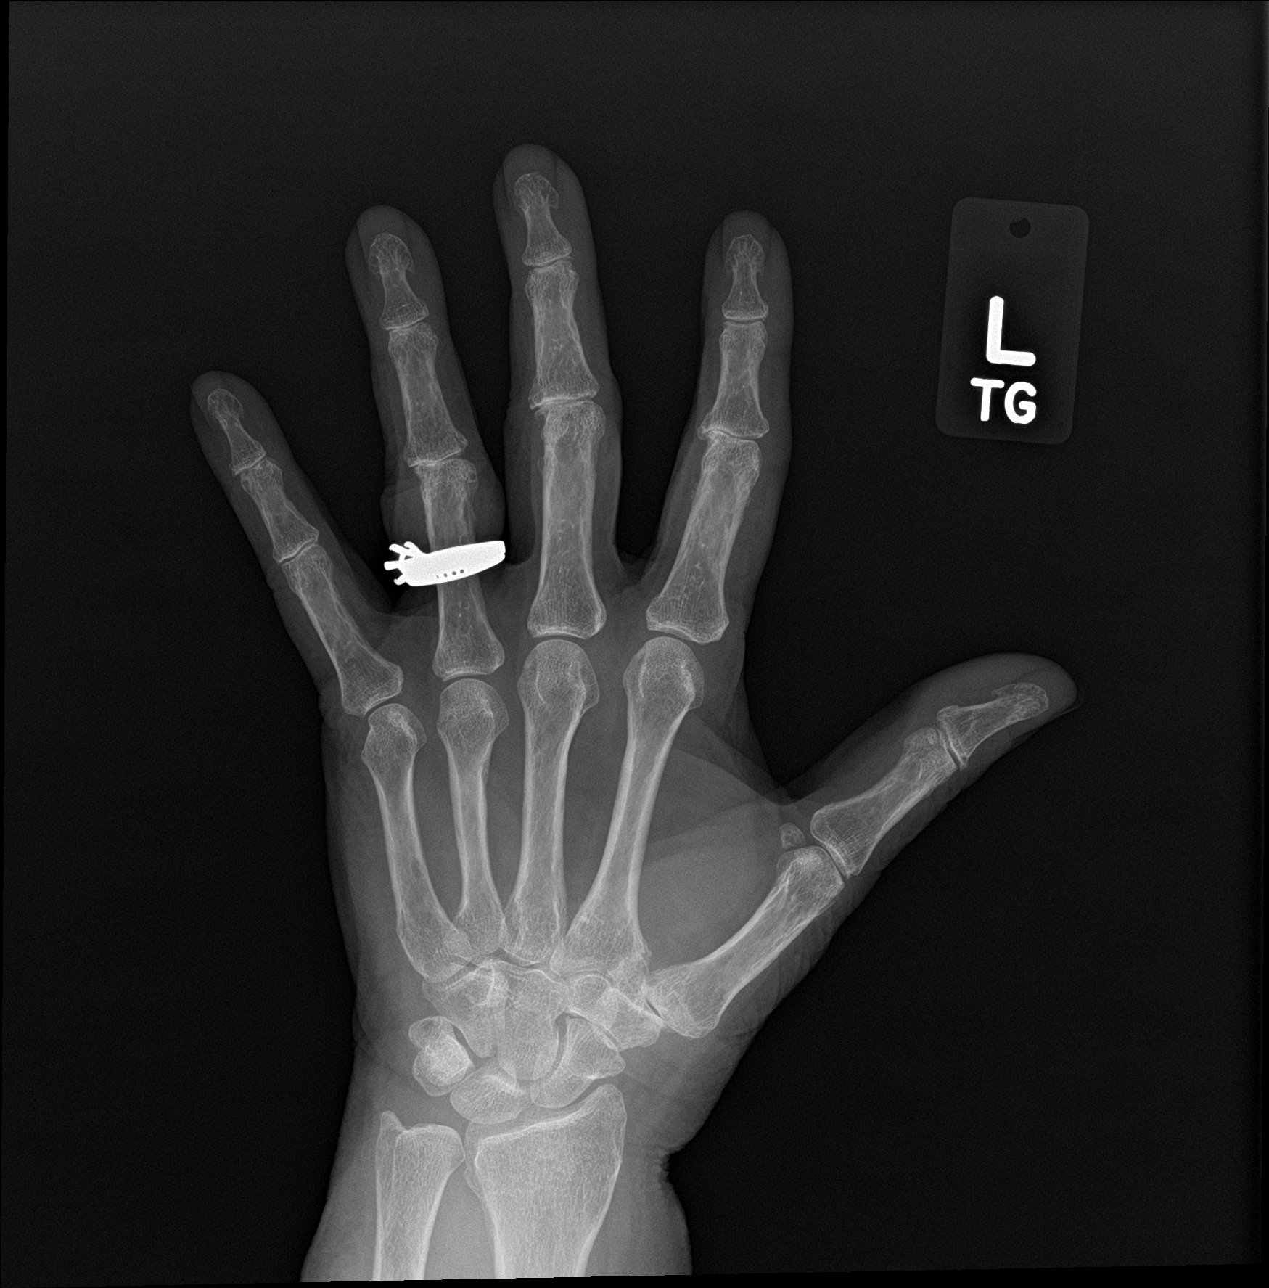

[hand obl]
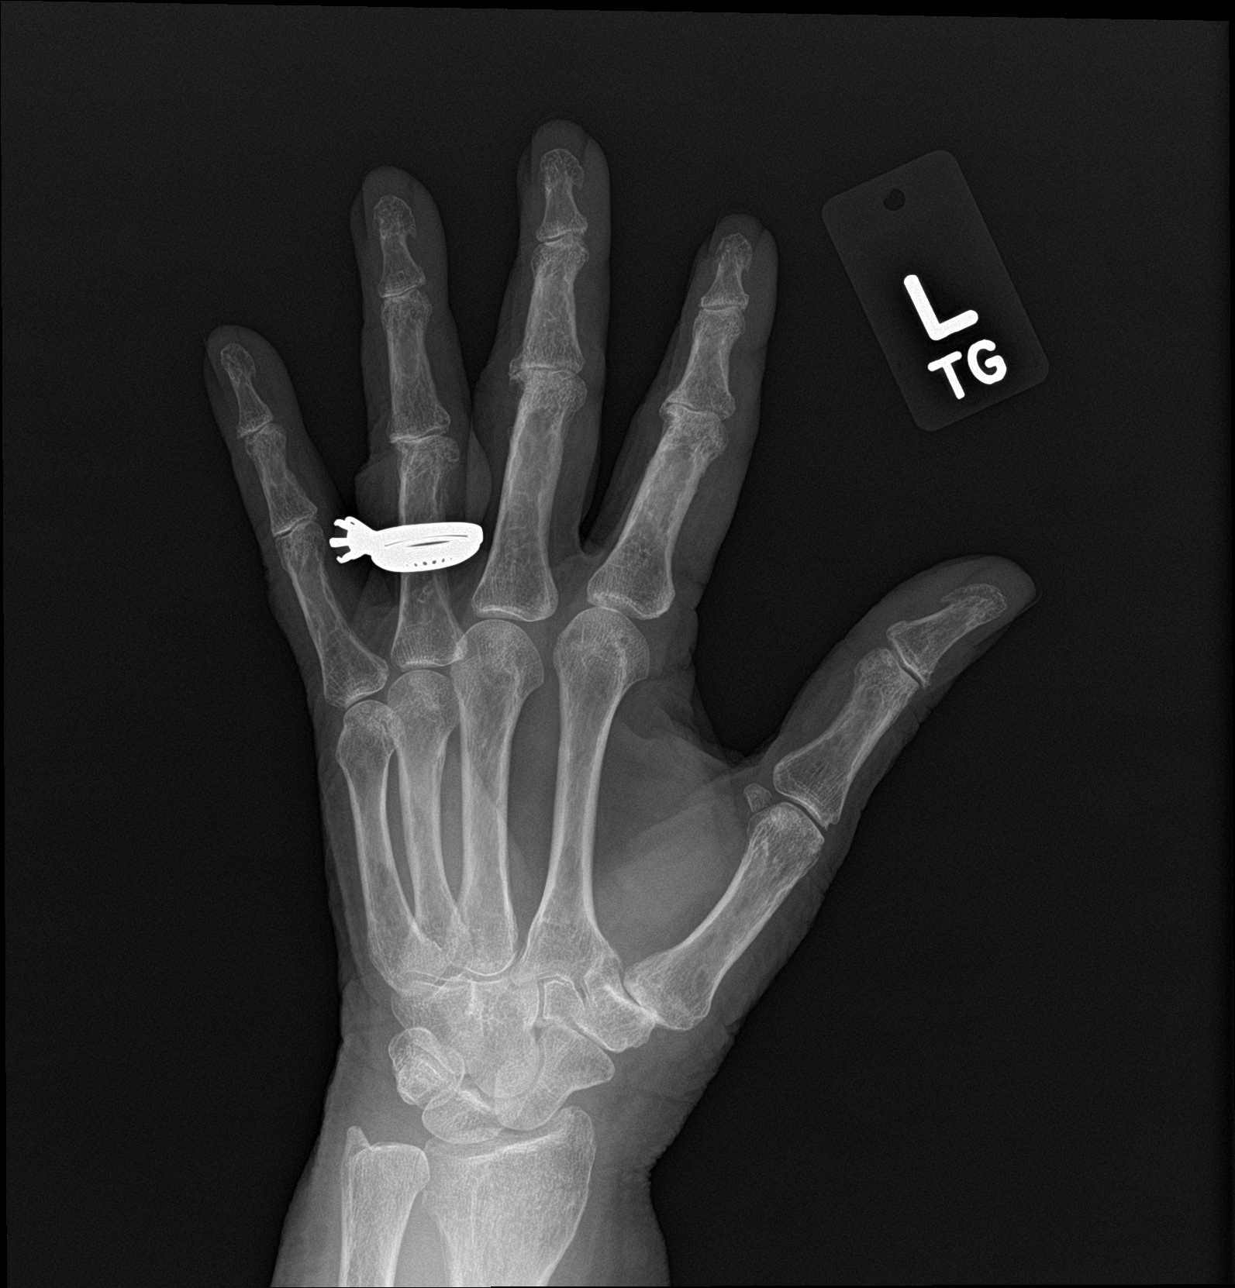

[hand lat]
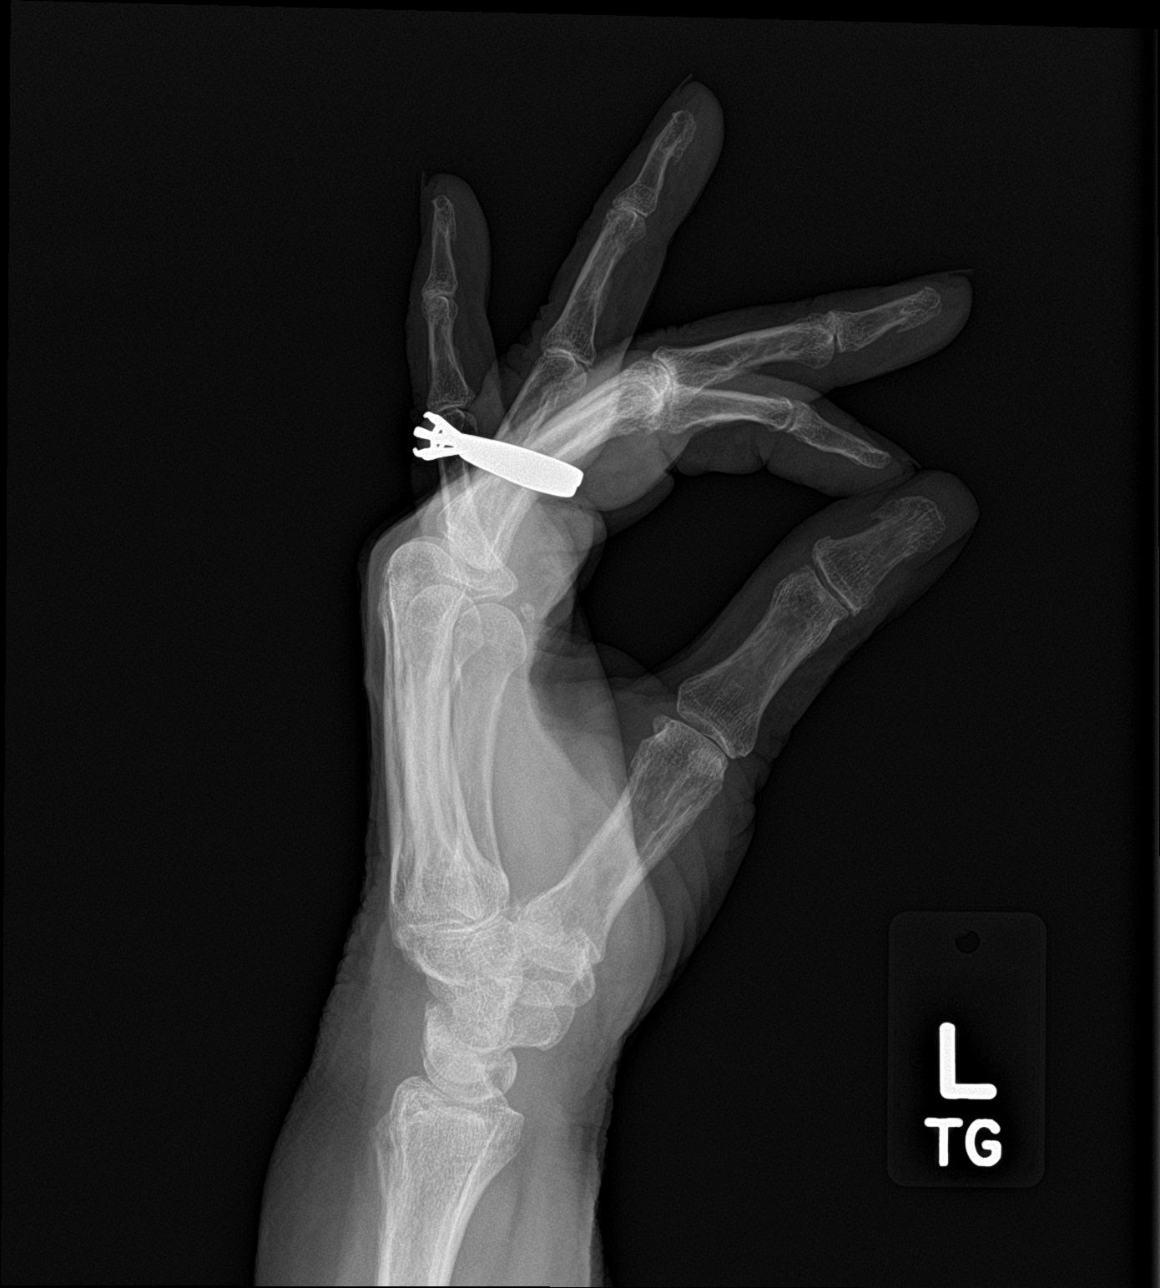

[3 of 3 positions shown; findings below may reference images not displayed]

FINDINGS: Mild degenerate change over the radial side of the carpal bones as
well as first carpometacarpal joint. Degenerative change over the
first MCP joint as well as degenerative changes of the
interphalangeal joints. Subtle transverse lucency with associated
cortical irregularity over the distal radial metaphysis suggesting a
nondisplaced fracture.
IMPRESSION: Suggestion of a nondisplaced transverse fracture of the distal
radial metaphysis.

## 2020-02-03 IMAGING — CT CT HEAD W/O CM
3 series · 14 of 46 positions shown, 16 images · non-contrast
Comparison: None.

CLINICAL DATA: Left forehead knot after fall this afternoon.
Headache.

EXAM:
CT HEAD WITHOUT CONTRAST
TECHNIQUE: Contiguous axial images were obtained from the base of the skull
through the vertex without intravenous contrast.

[Series 2: head wo · axial · 0.46mm/px · z∈[+1048,+1168]mm · 8 of 29 slices shown, 10 images]
[im 3/29  brain]
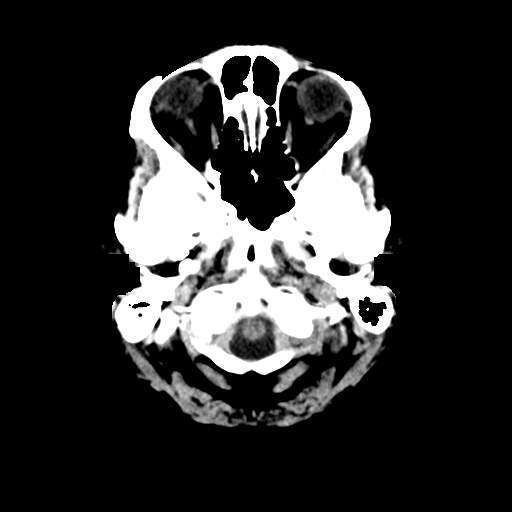
[im 3/29  bone]
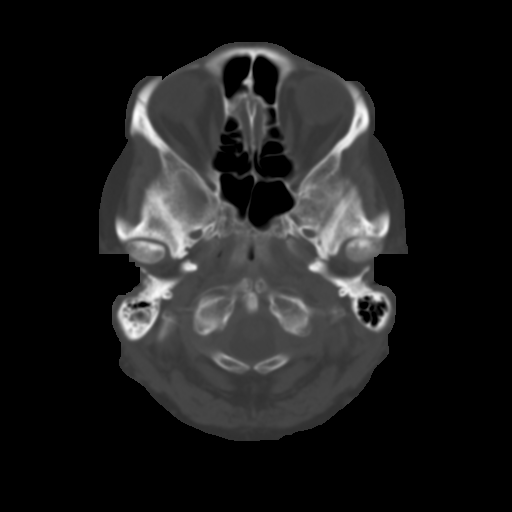
[im 7/29  brain]
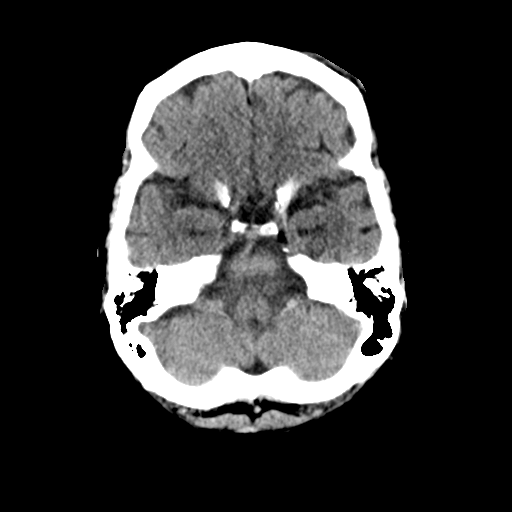
[im 10/29  brain]
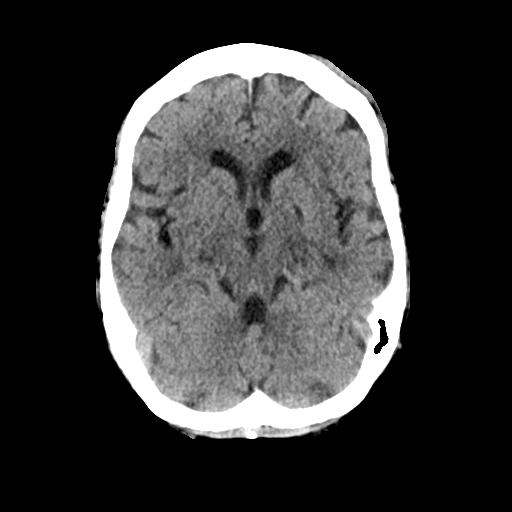
[im 13/29  brain]
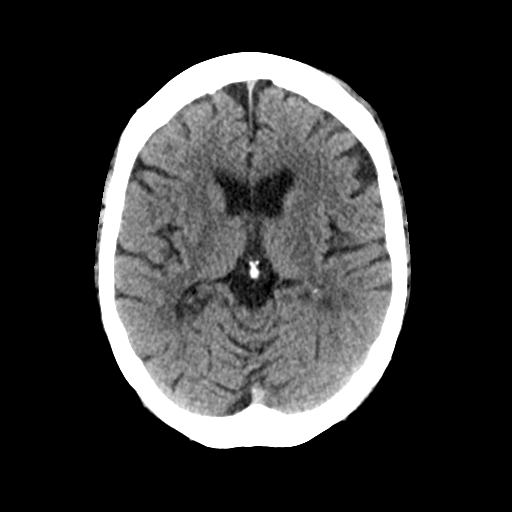
[im 17/29  brain]
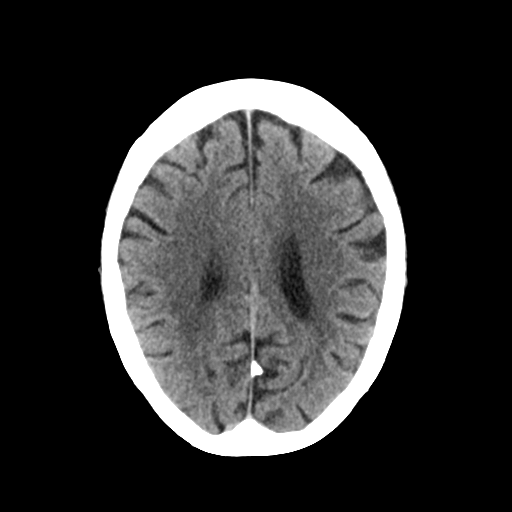
[im 17/29  bone]
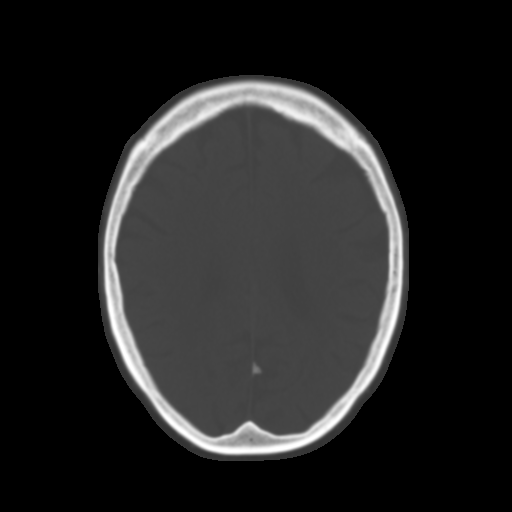
[im 20/29  brain]
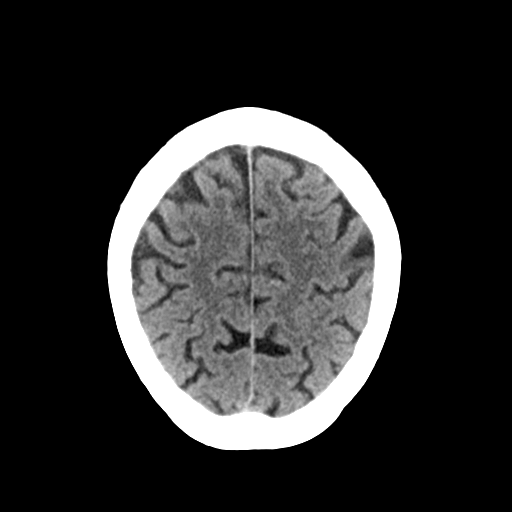
[im 23/29  brain]
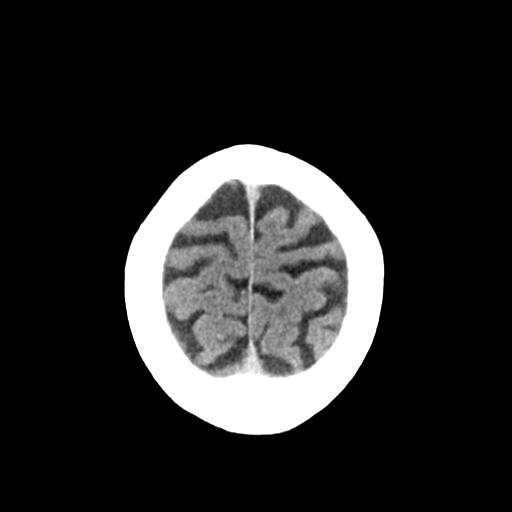
[im 27/29  brain]
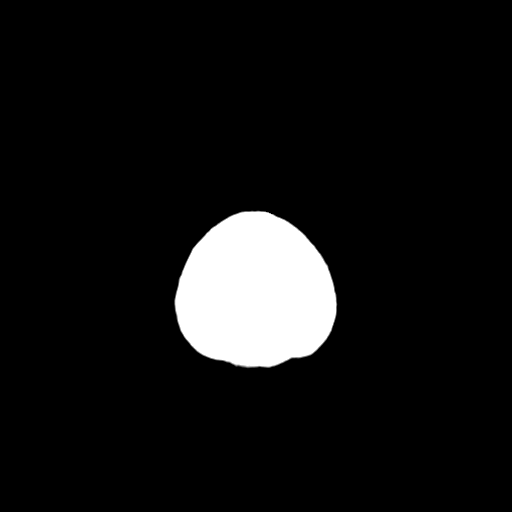

[Series 4: head wo coronal · coronal · 0.29mm/px · 3 of 69 slices shown]
[im 23/69  brain]
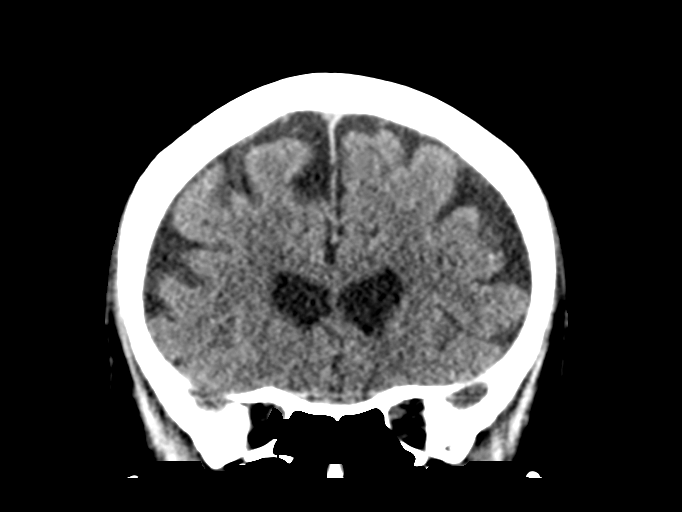
[im 31/69  brain]
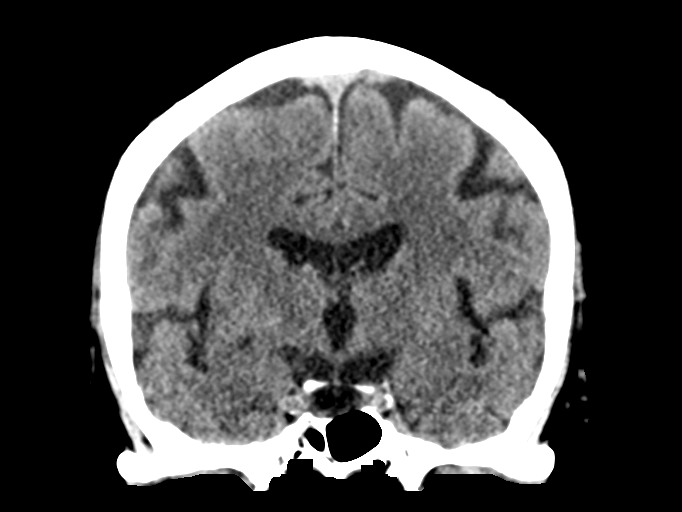
[im 38/69  brain]
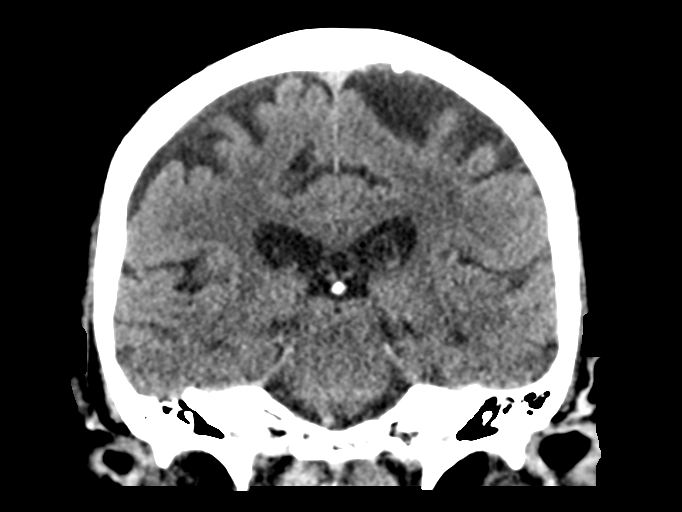

[Series 5: head wo sagittal · sagittal · 0.30mm/px · 3 of 58 slices shown]
[im 20/58  brain]
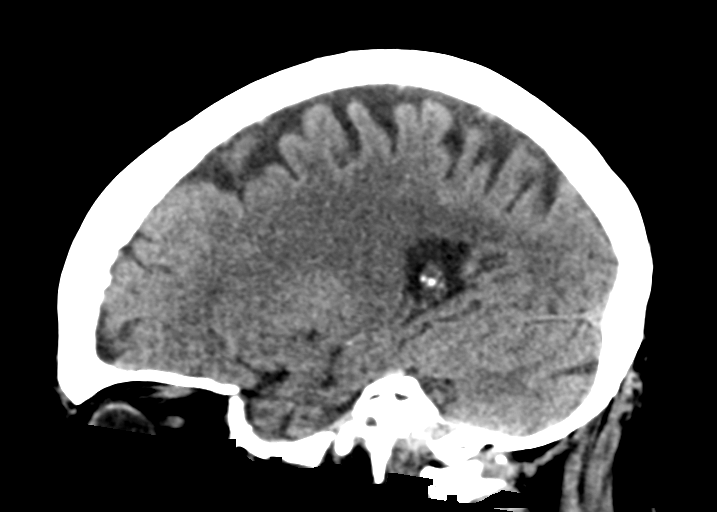
[im 29/58  brain]
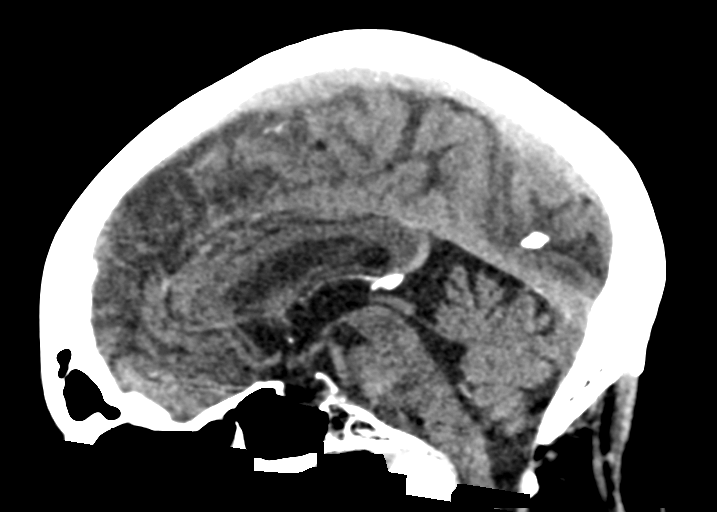
[im 39/58  brain]
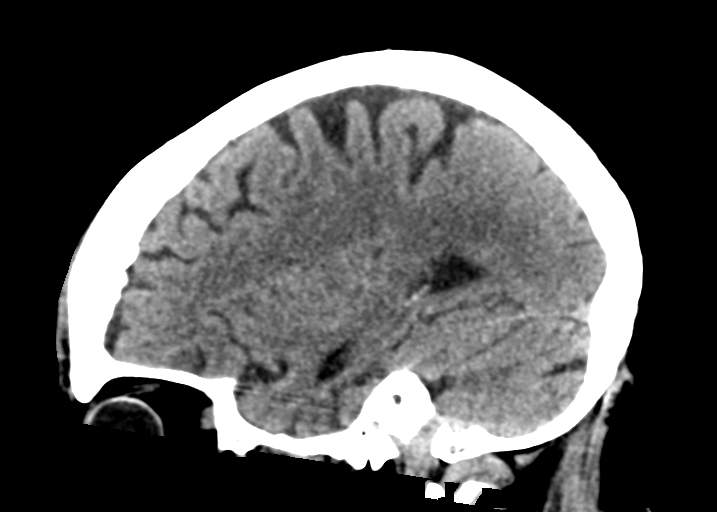

[14 of 46 positions shown; findings below may reference images not displayed]

FINDINGS: BRAIN: There is mild sulcal and ventricular prominence consistent
with age related involutional changes of the brain. No
intraparenchymal hemorrhage, mass effect nor midline shift.
Periventricular and subcortical white matter hypodensities
consistent with chronic small vessel ischemic disease are
identified. No acute large vascular territory infarcts. No abnormal
extra-axial fluid collections. Basal cisterns are not effaced and
midline.

VASCULAR: Mild-to-moderate calcific atherosclerosis of the carotid
siphons.

SKULL: No skull fracture.  No suspicious osseous lesions.

SINUSES/ORBITS: The mastoid air-cells are clear. The included
paranasal sinuses are well-aerated.The included ocular globes and
orbital contents are non-suspicious.

OTHER: Mild left frontoparietal soft tissue swelling/contusion.
IMPRESSION: 1. Age related involutional changes of the brain with chronic
microvascular ischemic disease. No acute intracranial abnormality.
2. Mild left frontoparietal scalp contusion without underlying
fracture.

## 2020-03-04 ENCOUNTER — Other Ambulatory Visit (HOSPITAL_COMMUNITY): Payer: Self-pay | Admitting: *Deleted

## 2020-03-05 ENCOUNTER — Other Ambulatory Visit: Payer: Self-pay

## 2020-03-05 ENCOUNTER — Ambulatory Visit (HOSPITAL_COMMUNITY)
Admission: RE | Admit: 2020-03-05 | Discharge: 2020-03-05 | Disposition: A | Discharge status: Home / Self Care | Payer: Medicare Other | Source: Ambulatory Visit | Attending: Internal Medicine | Admitting: Internal Medicine

## 2020-03-05 DIAGNOSIS — M810 Age-related osteoporosis without current pathological fracture: Secondary | ICD-10-CM | POA: Diagnosis not present

## 2020-03-05 MED ORDER — DENOSUMAB 60 MG/ML ~~LOC~~ SOSY: 60.0000 mg | PREFILLED_SYRINGE | Freq: Once | SUBCUTANEOUS | Status: AC

## 2020-03-05 MED ORDER — DENOSUMAB 60 MG/ML ~~LOC~~ SOSY
PREFILLED_SYRINGE | SUBCUTANEOUS | Status: AC
  Administered 2020-03-05: 60 mg via SUBCUTANEOUS
  Filled 2020-03-05: qty 1

## 2020-03-18 DIAGNOSIS — H353213 Exudative age-related macular degeneration, right eye, with inactive scar: Secondary | ICD-10-CM | POA: Diagnosis not present

## 2020-03-18 DIAGNOSIS — H353221 Exudative age-related macular degeneration, left eye, with active choroidal neovascularization: Secondary | ICD-10-CM | POA: Diagnosis not present

## 2020-03-18 DIAGNOSIS — H43393 Other vitreous opacities, bilateral: Secondary | ICD-10-CM | POA: Diagnosis not present

## 2020-03-18 DIAGNOSIS — H35371 Puckering of macula, right eye: Secondary | ICD-10-CM | POA: Diagnosis not present

## 2020-05-06 DIAGNOSIS — M109 Gout, unspecified: Secondary | ICD-10-CM | POA: Diagnosis not present

## 2020-05-06 DIAGNOSIS — M810 Age-related osteoporosis without current pathological fracture: Secondary | ICD-10-CM | POA: Diagnosis not present

## 2020-05-06 DIAGNOSIS — R7301 Impaired fasting glucose: Secondary | ICD-10-CM | POA: Diagnosis not present

## 2020-05-06 DIAGNOSIS — E669 Obesity, unspecified: Secondary | ICD-10-CM | POA: Diagnosis not present

## 2020-05-06 DIAGNOSIS — E785 Hyperlipidemia, unspecified: Secondary | ICD-10-CM | POA: Diagnosis not present

## 2020-05-06 DIAGNOSIS — I10 Essential (primary) hypertension: Secondary | ICD-10-CM | POA: Diagnosis not present

## 2020-06-10 DIAGNOSIS — H43813 Vitreous degeneration, bilateral: Secondary | ICD-10-CM | POA: Diagnosis not present

## 2020-06-10 DIAGNOSIS — H35371 Puckering of macula, right eye: Secondary | ICD-10-CM | POA: Diagnosis not present

## 2020-06-10 DIAGNOSIS — H353213 Exudative age-related macular degeneration, right eye, with inactive scar: Secondary | ICD-10-CM | POA: Diagnosis not present

## 2020-06-10 DIAGNOSIS — H353221 Exudative age-related macular degeneration, left eye, with active choroidal neovascularization: Secondary | ICD-10-CM | POA: Diagnosis not present

## 2020-08-19 ENCOUNTER — Other Ambulatory Visit (HOSPITAL_COMMUNITY): Payer: Self-pay | Admitting: *Deleted

## 2020-08-20 ENCOUNTER — Ambulatory Visit (HOSPITAL_COMMUNITY)
Admission: RE | Admit: 2020-08-20 | Discharge: 2020-08-20 | Disposition: A | Discharge status: Home / Self Care | Payer: Medicare Other | Source: Ambulatory Visit | Attending: Internal Medicine | Admitting: Internal Medicine

## 2020-08-20 ENCOUNTER — Other Ambulatory Visit: Payer: Self-pay

## 2020-08-20 DIAGNOSIS — M810 Age-related osteoporosis without current pathological fracture: Secondary | ICD-10-CM | POA: Insufficient documentation

## 2020-08-20 MED ORDER — DENOSUMAB 60 MG/ML ~~LOC~~ SOSY
PREFILLED_SYRINGE | SUBCUTANEOUS | Status: AC
  Filled 2020-08-20: qty 1

## 2020-08-20 MED ORDER — DENOSUMAB 60 MG/ML ~~LOC~~ SOSY
60.0000 mg | PREFILLED_SYRINGE | Freq: Once | SUBCUTANEOUS | Status: AC
  Administered 2020-08-20: 60 mg via SUBCUTANEOUS

## 2020-09-02 DIAGNOSIS — H353221 Exudative age-related macular degeneration, left eye, with active choroidal neovascularization: Secondary | ICD-10-CM | POA: Diagnosis not present

## 2020-09-02 DIAGNOSIS — H353213 Exudative age-related macular degeneration, right eye, with inactive scar: Secondary | ICD-10-CM | POA: Diagnosis not present

## 2020-09-02 DIAGNOSIS — H35371 Puckering of macula, right eye: Secondary | ICD-10-CM | POA: Diagnosis not present

## 2020-09-02 DIAGNOSIS — H43813 Vitreous degeneration, bilateral: Secondary | ICD-10-CM | POA: Diagnosis not present

## 2020-10-06 DIAGNOSIS — Z23 Encounter for immunization: Secondary | ICD-10-CM | POA: Diagnosis not present

## 2020-10-21 DIAGNOSIS — Z23 Encounter for immunization: Secondary | ICD-10-CM | POA: Diagnosis not present

## 2020-11-04 DIAGNOSIS — L821 Other seborrheic keratosis: Secondary | ICD-10-CM | POA: Diagnosis not present

## 2020-11-04 DIAGNOSIS — D224 Melanocytic nevi of scalp and neck: Secondary | ICD-10-CM | POA: Diagnosis not present

## 2020-11-04 DIAGNOSIS — Z85828 Personal history of other malignant neoplasm of skin: Secondary | ICD-10-CM | POA: Diagnosis not present

## 2020-11-04 DIAGNOSIS — Z85820 Personal history of malignant melanoma of skin: Secondary | ICD-10-CM | POA: Diagnosis not present

## 2020-11-30 DIAGNOSIS — M109 Gout, unspecified: Secondary | ICD-10-CM | POA: Diagnosis not present

## 2020-11-30 DIAGNOSIS — R7301 Impaired fasting glucose: Secondary | ICD-10-CM | POA: Diagnosis not present

## 2020-11-30 DIAGNOSIS — E785 Hyperlipidemia, unspecified: Secondary | ICD-10-CM | POA: Diagnosis not present

## 2020-11-30 DIAGNOSIS — I10 Essential (primary) hypertension: Secondary | ICD-10-CM | POA: Diagnosis not present

## 2020-11-30 DIAGNOSIS — M810 Age-related osteoporosis without current pathological fracture: Secondary | ICD-10-CM | POA: Diagnosis not present

## 2020-12-02 DIAGNOSIS — H43813 Vitreous degeneration, bilateral: Secondary | ICD-10-CM | POA: Diagnosis not present

## 2020-12-02 DIAGNOSIS — H35371 Puckering of macula, right eye: Secondary | ICD-10-CM | POA: Diagnosis not present

## 2020-12-02 DIAGNOSIS — H353221 Exudative age-related macular degeneration, left eye, with active choroidal neovascularization: Secondary | ICD-10-CM | POA: Diagnosis not present

## 2020-12-02 DIAGNOSIS — H353213 Exudative age-related macular degeneration, right eye, with inactive scar: Secondary | ICD-10-CM | POA: Diagnosis not present

## 2020-12-07 DIAGNOSIS — E669 Obesity, unspecified: Secondary | ICD-10-CM | POA: Diagnosis not present

## 2020-12-07 DIAGNOSIS — I10 Essential (primary) hypertension: Secondary | ICD-10-CM | POA: Diagnosis not present

## 2020-12-07 DIAGNOSIS — H6123 Impacted cerumen, bilateral: Secondary | ICD-10-CM | POA: Diagnosis not present

## 2020-12-07 DIAGNOSIS — Z23 Encounter for immunization: Secondary | ICD-10-CM | POA: Diagnosis not present

## 2020-12-07 DIAGNOSIS — R82998 Other abnormal findings in urine: Secondary | ICD-10-CM | POA: Diagnosis not present

## 2020-12-07 DIAGNOSIS — Z1331 Encounter for screening for depression: Secondary | ICD-10-CM | POA: Diagnosis not present

## 2020-12-07 DIAGNOSIS — E785 Hyperlipidemia, unspecified: Secondary | ICD-10-CM | POA: Diagnosis not present

## 2020-12-07 DIAGNOSIS — M810 Age-related osteoporosis without current pathological fracture: Secondary | ICD-10-CM | POA: Diagnosis not present

## 2020-12-07 DIAGNOSIS — Z1339 Encounter for screening examination for other mental health and behavioral disorders: Secondary | ICD-10-CM | POA: Diagnosis not present

## 2020-12-07 DIAGNOSIS — R7301 Impaired fasting glucose: Secondary | ICD-10-CM | POA: Diagnosis not present

## 2020-12-07 DIAGNOSIS — Z0000 Encounter for general adult medical examination without abnormal findings: Secondary | ICD-10-CM | POA: Diagnosis not present

## 2021-02-15 DIAGNOSIS — H6123 Impacted cerumen, bilateral: Secondary | ICD-10-CM | POA: Diagnosis not present

## 2021-02-15 DIAGNOSIS — Z974 Presence of external hearing-aid: Secondary | ICD-10-CM | POA: Diagnosis not present

## 2021-02-15 DIAGNOSIS — M270 Developmental disorders of jaws: Secondary | ICD-10-CM | POA: Diagnosis not present

## 2021-02-15 DIAGNOSIS — H61893 Other specified disorders of external ear, bilateral: Secondary | ICD-10-CM | POA: Diagnosis not present

## 2021-03-03 DIAGNOSIS — H353213 Exudative age-related macular degeneration, right eye, with inactive scar: Secondary | ICD-10-CM | POA: Diagnosis not present

## 2021-03-03 DIAGNOSIS — H43393 Other vitreous opacities, bilateral: Secondary | ICD-10-CM | POA: Diagnosis not present

## 2021-03-03 DIAGNOSIS — H43813 Vitreous degeneration, bilateral: Secondary | ICD-10-CM | POA: Diagnosis not present

## 2021-03-03 DIAGNOSIS — H353221 Exudative age-related macular degeneration, left eye, with active choroidal neovascularization: Secondary | ICD-10-CM | POA: Diagnosis not present

## 2021-03-04 ENCOUNTER — Other Ambulatory Visit (HOSPITAL_COMMUNITY): Payer: Self-pay

## 2021-03-07 ENCOUNTER — Ambulatory Visit (HOSPITAL_COMMUNITY)
Admission: RE | Admit: 2021-03-07 | Discharge: 2021-03-07 | Disposition: A | Discharge status: Home / Self Care | Payer: Medicare Other | Source: Ambulatory Visit | Attending: Internal Medicine | Admitting: Internal Medicine

## 2021-03-07 DIAGNOSIS — M810 Age-related osteoporosis without current pathological fracture: Secondary | ICD-10-CM | POA: Insufficient documentation

## 2021-03-07 MED ORDER — DENOSUMAB 60 MG/ML ~~LOC~~ SOSY
PREFILLED_SYRINGE | SUBCUTANEOUS | Status: AC
  Filled 2021-03-07: qty 1

## 2021-03-07 MED ORDER — DENOSUMAB 60 MG/ML ~~LOC~~ SOSY
60.0000 mg | PREFILLED_SYRINGE | Freq: Once | SUBCUTANEOUS | Status: AC
  Administered 2021-03-07: 60 mg via SUBCUTANEOUS

## 2021-06-09 DIAGNOSIS — H353213 Exudative age-related macular degeneration, right eye, with inactive scar: Secondary | ICD-10-CM | POA: Diagnosis not present

## 2021-06-09 DIAGNOSIS — H43813 Vitreous degeneration, bilateral: Secondary | ICD-10-CM | POA: Diagnosis not present

## 2021-06-09 DIAGNOSIS — H353221 Exudative age-related macular degeneration, left eye, with active choroidal neovascularization: Secondary | ICD-10-CM | POA: Diagnosis not present

## 2021-06-09 DIAGNOSIS — H35371 Puckering of macula, right eye: Secondary | ICD-10-CM | POA: Diagnosis not present

## 2021-06-16 DIAGNOSIS — F439 Reaction to severe stress, unspecified: Secondary | ICD-10-CM | POA: Diagnosis not present

## 2021-06-16 DIAGNOSIS — M810 Age-related osteoporosis without current pathological fracture: Secondary | ICD-10-CM | POA: Diagnosis not present

## 2021-06-16 DIAGNOSIS — I10 Essential (primary) hypertension: Secondary | ICD-10-CM | POA: Diagnosis not present

## 2021-06-16 DIAGNOSIS — E785 Hyperlipidemia, unspecified: Secondary | ICD-10-CM | POA: Diagnosis not present

## 2021-06-16 DIAGNOSIS — E669 Obesity, unspecified: Secondary | ICD-10-CM | POA: Diagnosis not present

## 2021-06-16 DIAGNOSIS — R7301 Impaired fasting glucose: Secondary | ICD-10-CM | POA: Diagnosis not present

## 2021-06-29 DIAGNOSIS — Z23 Encounter for immunization: Secondary | ICD-10-CM | POA: Diagnosis not present

## 2021-09-09 ENCOUNTER — Other Ambulatory Visit (HOSPITAL_COMMUNITY): Payer: Self-pay | Admitting: *Deleted

## 2021-09-12 ENCOUNTER — Ambulatory Visit (HOSPITAL_COMMUNITY)
Admission: RE | Admit: 2021-09-12 | Discharge: 2021-09-12 | Disposition: A | Discharge status: Home / Self Care | Payer: Medicare Other | Source: Ambulatory Visit | Attending: Internal Medicine | Admitting: Internal Medicine

## 2021-09-12 DIAGNOSIS — M810 Age-related osteoporosis without current pathological fracture: Secondary | ICD-10-CM | POA: Diagnosis not present

## 2021-09-12 MED ORDER — DENOSUMAB 60 MG/ML ~~LOC~~ SOSY
PREFILLED_SYRINGE | SUBCUTANEOUS | Status: AC
  Filled 2021-09-12: qty 1

## 2021-09-12 MED ORDER — DENOSUMAB 60 MG/ML ~~LOC~~ SOSY
60.0000 mg | PREFILLED_SYRINGE | Freq: Once | SUBCUTANEOUS | Status: AC
  Administered 2021-09-12: 60 mg via SUBCUTANEOUS

## 2021-09-15 DIAGNOSIS — H43813 Vitreous degeneration, bilateral: Secondary | ICD-10-CM | POA: Diagnosis not present

## 2021-09-15 DIAGNOSIS — H35371 Puckering of macula, right eye: Secondary | ICD-10-CM | POA: Diagnosis not present

## 2021-09-15 DIAGNOSIS — H353213 Exudative age-related macular degeneration, right eye, with inactive scar: Secondary | ICD-10-CM | POA: Diagnosis not present

## 2021-09-15 DIAGNOSIS — H353221 Exudative age-related macular degeneration, left eye, with active choroidal neovascularization: Secondary | ICD-10-CM | POA: Diagnosis not present

## 2021-11-22 DIAGNOSIS — Z23 Encounter for immunization: Secondary | ICD-10-CM | POA: Diagnosis not present

## 2021-12-19 DIAGNOSIS — M810 Age-related osteoporosis without current pathological fracture: Secondary | ICD-10-CM | POA: Diagnosis not present

## 2021-12-19 DIAGNOSIS — I10 Essential (primary) hypertension: Secondary | ICD-10-CM | POA: Diagnosis not present

## 2021-12-19 DIAGNOSIS — R7301 Impaired fasting glucose: Secondary | ICD-10-CM | POA: Diagnosis not present

## 2021-12-19 DIAGNOSIS — M109 Gout, unspecified: Secondary | ICD-10-CM | POA: Diagnosis not present

## 2021-12-19 DIAGNOSIS — E785 Hyperlipidemia, unspecified: Secondary | ICD-10-CM | POA: Diagnosis not present

## 2021-12-22 DIAGNOSIS — H353213 Exudative age-related macular degeneration, right eye, with inactive scar: Secondary | ICD-10-CM | POA: Diagnosis not present

## 2021-12-22 DIAGNOSIS — H353221 Exudative age-related macular degeneration, left eye, with active choroidal neovascularization: Secondary | ICD-10-CM | POA: Diagnosis not present

## 2021-12-22 DIAGNOSIS — H35371 Puckering of macula, right eye: Secondary | ICD-10-CM | POA: Diagnosis not present

## 2021-12-22 DIAGNOSIS — H43813 Vitreous degeneration, bilateral: Secondary | ICD-10-CM | POA: Diagnosis not present

## 2021-12-26 DIAGNOSIS — F439 Reaction to severe stress, unspecified: Secondary | ICD-10-CM | POA: Diagnosis not present

## 2021-12-26 DIAGNOSIS — R82998 Other abnormal findings in urine: Secondary | ICD-10-CM | POA: Diagnosis not present

## 2021-12-26 DIAGNOSIS — R7301 Impaired fasting glucose: Secondary | ICD-10-CM | POA: Diagnosis not present

## 2021-12-26 DIAGNOSIS — H6123 Impacted cerumen, bilateral: Secondary | ICD-10-CM | POA: Diagnosis not present

## 2021-12-26 DIAGNOSIS — Z0000 Encounter for general adult medical examination without abnormal findings: Secondary | ICD-10-CM | POA: Diagnosis not present

## 2021-12-26 DIAGNOSIS — I10 Essential (primary) hypertension: Secondary | ICD-10-CM | POA: Diagnosis not present

## 2021-12-26 DIAGNOSIS — Z1331 Encounter for screening for depression: Secondary | ICD-10-CM | POA: Diagnosis not present

## 2021-12-26 DIAGNOSIS — R413 Other amnesia: Secondary | ICD-10-CM | POA: Diagnosis not present

## 2021-12-26 DIAGNOSIS — M810 Age-related osteoporosis without current pathological fracture: Secondary | ICD-10-CM | POA: Diagnosis not present

## 2021-12-26 DIAGNOSIS — Z1339 Encounter for screening examination for other mental health and behavioral disorders: Secondary | ICD-10-CM | POA: Diagnosis not present

## 2021-12-26 DIAGNOSIS — E669 Obesity, unspecified: Secondary | ICD-10-CM | POA: Diagnosis not present

## 2021-12-26 DIAGNOSIS — E785 Hyperlipidemia, unspecified: Secondary | ICD-10-CM | POA: Diagnosis not present

## 2022-01-25 DIAGNOSIS — I10 Essential (primary) hypertension: Secondary | ICD-10-CM | POA: Diagnosis not present

## 2022-01-25 DIAGNOSIS — R413 Other amnesia: Secondary | ICD-10-CM | POA: Diagnosis not present

## 2022-03-17 ENCOUNTER — Other Ambulatory Visit (HOSPITAL_COMMUNITY): Payer: Self-pay | Admitting: *Deleted

## 2022-03-17 ENCOUNTER — Encounter (HOSPITAL_COMMUNITY): Payer: Medicare Other

## 2022-03-20 ENCOUNTER — Ambulatory Visit (HOSPITAL_COMMUNITY)
Admission: RE | Admit: 2022-03-20 | Discharge: 2022-03-20 | Disposition: A | Discharge status: Home / Self Care | Payer: Medicare Other | Source: Ambulatory Visit | Attending: Internal Medicine | Admitting: Internal Medicine

## 2022-03-20 DIAGNOSIS — M810 Age-related osteoporosis without current pathological fracture: Secondary | ICD-10-CM | POA: Diagnosis not present

## 2022-03-20 MED ORDER — DENOSUMAB 60 MG/ML ~~LOC~~ SOSY
PREFILLED_SYRINGE | SUBCUTANEOUS | Status: AC
  Filled 2022-03-20: qty 1

## 2022-03-20 MED ORDER — DENOSUMAB 60 MG/ML ~~LOC~~ SOSY
60.0000 mg | PREFILLED_SYRINGE | Freq: Once | SUBCUTANEOUS | Status: AC
  Administered 2022-03-20: 60 mg via SUBCUTANEOUS

## 2022-03-30 DIAGNOSIS — H353221 Exudative age-related macular degeneration, left eye, with active choroidal neovascularization: Secondary | ICD-10-CM | POA: Diagnosis not present

## 2022-03-30 DIAGNOSIS — H353212 Exudative age-related macular degeneration, right eye, with inactive choroidal neovascularization: Secondary | ICD-10-CM | POA: Diagnosis not present

## 2022-03-30 DIAGNOSIS — H35371 Puckering of macula, right eye: Secondary | ICD-10-CM | POA: Diagnosis not present

## 2022-03-30 DIAGNOSIS — H43813 Vitreous degeneration, bilateral: Secondary | ICD-10-CM | POA: Diagnosis not present

## 2022-04-25 DIAGNOSIS — L820 Inflamed seborrheic keratosis: Secondary | ICD-10-CM | POA: Diagnosis not present

## 2022-04-25 DIAGNOSIS — L821 Other seborrheic keratosis: Secondary | ICD-10-CM | POA: Diagnosis not present

## 2022-04-25 DIAGNOSIS — Z85820 Personal history of malignant melanoma of skin: Secondary | ICD-10-CM | POA: Diagnosis not present

## 2022-04-25 DIAGNOSIS — L298 Other pruritus: Secondary | ICD-10-CM | POA: Diagnosis not present

## 2022-04-25 DIAGNOSIS — Z789 Other specified health status: Secondary | ICD-10-CM | POA: Diagnosis not present

## 2022-04-25 DIAGNOSIS — Z08 Encounter for follow-up examination after completed treatment for malignant neoplasm: Secondary | ICD-10-CM | POA: Diagnosis not present

## 2022-04-25 DIAGNOSIS — L538 Other specified erythematous conditions: Secondary | ICD-10-CM | POA: Diagnosis not present

## 2022-07-27 DIAGNOSIS — H353212 Exudative age-related macular degeneration, right eye, with inactive choroidal neovascularization: Secondary | ICD-10-CM | POA: Diagnosis not present

## 2022-07-27 DIAGNOSIS — H353221 Exudative age-related macular degeneration, left eye, with active choroidal neovascularization: Secondary | ICD-10-CM | POA: Diagnosis not present

## 2022-07-27 DIAGNOSIS — H35371 Puckering of macula, right eye: Secondary | ICD-10-CM | POA: Diagnosis not present

## 2022-07-27 DIAGNOSIS — H43813 Vitreous degeneration, bilateral: Secondary | ICD-10-CM | POA: Diagnosis not present

## 2022-08-02 DIAGNOSIS — E669 Obesity, unspecified: Secondary | ICD-10-CM | POA: Diagnosis not present

## 2022-08-02 DIAGNOSIS — R413 Other amnesia: Secondary | ICD-10-CM | POA: Diagnosis not present

## 2022-08-02 DIAGNOSIS — E785 Hyperlipidemia, unspecified: Secondary | ICD-10-CM | POA: Diagnosis not present

## 2022-08-02 DIAGNOSIS — M109 Gout, unspecified: Secondary | ICD-10-CM | POA: Diagnosis not present

## 2022-08-02 DIAGNOSIS — F439 Reaction to severe stress, unspecified: Secondary | ICD-10-CM | POA: Diagnosis not present

## 2022-08-02 DIAGNOSIS — R7301 Impaired fasting glucose: Secondary | ICD-10-CM | POA: Diagnosis not present

## 2022-08-02 DIAGNOSIS — M810 Age-related osteoporosis without current pathological fracture: Secondary | ICD-10-CM | POA: Diagnosis not present

## 2022-08-02 DIAGNOSIS — I10 Essential (primary) hypertension: Secondary | ICD-10-CM | POA: Diagnosis not present

## 2022-10-11 DIAGNOSIS — Z23 Encounter for immunization: Secondary | ICD-10-CM | POA: Diagnosis not present

## 2022-10-26 ENCOUNTER — Other Ambulatory Visit (HOSPITAL_COMMUNITY): Payer: Self-pay

## 2022-10-30 ENCOUNTER — Ambulatory Visit (HOSPITAL_COMMUNITY)
Admission: RE | Admit: 2022-10-30 | Discharge: 2022-10-30 | Disposition: A | Discharge status: Home / Self Care | Payer: Medicare Other | Source: Ambulatory Visit | Attending: Internal Medicine | Admitting: Internal Medicine

## 2022-10-30 DIAGNOSIS — M810 Age-related osteoporosis without current pathological fracture: Secondary | ICD-10-CM | POA: Diagnosis not present

## 2022-10-30 MED ORDER — DENOSUMAB 60 MG/ML ~~LOC~~ SOSY
PREFILLED_SYRINGE | SUBCUTANEOUS | Status: AC
  Filled 2022-10-30: qty 1

## 2022-10-30 MED ORDER — DENOSUMAB 60 MG/ML ~~LOC~~ SOSY
60.0000 mg | PREFILLED_SYRINGE | Freq: Once | SUBCUTANEOUS | Status: AC
  Administered 2022-10-30: 60 mg via SUBCUTANEOUS

## 2022-11-16 DIAGNOSIS — H353213 Exudative age-related macular degeneration, right eye, with inactive scar: Secondary | ICD-10-CM | POA: Diagnosis not present

## 2022-11-16 DIAGNOSIS — H353221 Exudative age-related macular degeneration, left eye, with active choroidal neovascularization: Secondary | ICD-10-CM | POA: Diagnosis not present

## 2022-11-16 DIAGNOSIS — H43813 Vitreous degeneration, bilateral: Secondary | ICD-10-CM | POA: Diagnosis not present

## 2022-11-16 DIAGNOSIS — H35371 Puckering of macula, right eye: Secondary | ICD-10-CM | POA: Diagnosis not present

## 2023-03-07 DIAGNOSIS — R7301 Impaired fasting glucose: Secondary | ICD-10-CM | POA: Diagnosis not present

## 2023-03-07 DIAGNOSIS — M109 Gout, unspecified: Secondary | ICD-10-CM | POA: Diagnosis not present

## 2023-03-07 DIAGNOSIS — I10 Essential (primary) hypertension: Secondary | ICD-10-CM | POA: Diagnosis not present

## 2023-03-07 DIAGNOSIS — E785 Hyperlipidemia, unspecified: Secondary | ICD-10-CM | POA: Diagnosis not present

## 2023-03-07 DIAGNOSIS — M810 Age-related osteoporosis without current pathological fracture: Secondary | ICD-10-CM | POA: Diagnosis not present

## 2023-03-14 DIAGNOSIS — R7301 Impaired fasting glucose: Secondary | ICD-10-CM | POA: Diagnosis not present

## 2023-03-14 DIAGNOSIS — M810 Age-related osteoporosis without current pathological fracture: Secondary | ICD-10-CM | POA: Diagnosis not present

## 2023-03-14 DIAGNOSIS — F439 Reaction to severe stress, unspecified: Secondary | ICD-10-CM | POA: Diagnosis not present

## 2023-03-14 DIAGNOSIS — E785 Hyperlipidemia, unspecified: Secondary | ICD-10-CM | POA: Diagnosis not present

## 2023-03-14 DIAGNOSIS — R413 Other amnesia: Secondary | ICD-10-CM | POA: Diagnosis not present

## 2023-03-14 DIAGNOSIS — R82998 Other abnormal findings in urine: Secondary | ICD-10-CM | POA: Diagnosis not present

## 2023-03-14 DIAGNOSIS — E669 Obesity, unspecified: Secondary | ICD-10-CM | POA: Diagnosis not present

## 2023-03-14 DIAGNOSIS — R002 Palpitations: Secondary | ICD-10-CM | POA: Diagnosis not present

## 2023-03-14 DIAGNOSIS — Z0000 Encounter for general adult medical examination without abnormal findings: Secondary | ICD-10-CM | POA: Diagnosis not present

## 2023-03-14 DIAGNOSIS — M109 Gout, unspecified: Secondary | ICD-10-CM | POA: Diagnosis not present

## 2023-03-14 DIAGNOSIS — H6123 Impacted cerumen, bilateral: Secondary | ICD-10-CM | POA: Diagnosis not present

## 2023-03-14 DIAGNOSIS — I10 Essential (primary) hypertension: Secondary | ICD-10-CM | POA: Diagnosis not present

## 2023-04-05 ENCOUNTER — Encounter (HOSPITAL_COMMUNITY): Payer: Self-pay

## 2023-04-05 ENCOUNTER — Emergency Department (HOSPITAL_COMMUNITY)
Admission: EM | Admit: 2023-04-05 | Discharge: 2023-04-05 | Disposition: A | Discharge status: Home / Self Care | Attending: Emergency Medicine | Admitting: Emergency Medicine

## 2023-04-05 ENCOUNTER — Emergency Department (HOSPITAL_COMMUNITY)

## 2023-04-05 ENCOUNTER — Other Ambulatory Visit: Payer: Self-pay

## 2023-04-05 DIAGNOSIS — R413 Other amnesia: Secondary | ICD-10-CM | POA: Diagnosis not present

## 2023-04-05 DIAGNOSIS — Z8616 Personal history of COVID-19: Secondary | ICD-10-CM | POA: Diagnosis not present

## 2023-04-05 DIAGNOSIS — R5383 Other fatigue: Secondary | ICD-10-CM | POA: Diagnosis present

## 2023-04-05 DIAGNOSIS — Z7901 Long term (current) use of anticoagulants: Secondary | ICD-10-CM | POA: Insufficient documentation

## 2023-04-05 DIAGNOSIS — I4891 Unspecified atrial fibrillation: Secondary | ICD-10-CM | POA: Diagnosis not present

## 2023-04-05 DIAGNOSIS — I480 Paroxysmal atrial fibrillation: Secondary | ICD-10-CM | POA: Insufficient documentation

## 2023-04-05 DIAGNOSIS — I10 Essential (primary) hypertension: Secondary | ICD-10-CM | POA: Diagnosis not present

## 2023-04-05 LAB — BASIC METABOLIC PANEL
Anion gap: 10 (ref 5–15)
BUN: 17 mg/dL (ref 8–23)
CO2: 25 mmol/L (ref 22–32)
Calcium: 9.8 mg/dL (ref 8.9–10.3)
Chloride: 106 mmol/L (ref 98–111)
Creatinine, Ser: 1.18 mg/dL — ABNORMAL HIGH (ref 0.44–1.00)
GFR, Estimated: 42 mL/min — ABNORMAL LOW (ref >60)
Glucose, Bld: 118 mg/dL — ABNORMAL HIGH (ref 70–99)
Potassium: 5 mmol/L (ref 3.5–5.1)
Sodium: 141 mmol/L (ref 135–145)

## 2023-04-05 LAB — TSH: TSH: 1.173 u[IU]/mL (ref 0.350–4.500)

## 2023-04-05 LAB — CBC
HCT: 45.2 % (ref 36.0–46.0)
Hemoglobin: 14.2 g/dL (ref 12.0–15.0)
MCH: 28 pg (ref 26.0–34.0)
MCHC: 31.4 g/dL (ref 30.0–36.0)
MCV: 89.2 fL (ref 80.0–100.0)
Platelets: 395 10*3/uL (ref 150–400)
RBC: 5.07 MIL/uL (ref 3.87–5.11)
RDW: 14.1 % (ref 11.5–15.5)
WBC: 9.1 10*3/uL (ref 4.0–10.5)
nRBC: 0 % (ref 0.0–0.2)

## 2023-04-05 LAB — TROPONIN I (HIGH SENSITIVITY)
Troponin I (High Sensitivity): 10 ng/L (ref <18)
Troponin I (High Sensitivity): 13 ng/L (ref <18)

## 2023-04-05 LAB — MAGNESIUM: Magnesium: 2 mg/dL (ref 1.7–2.4)

## 2023-04-05 MED ORDER — APIXABAN 2.5 MG PO TABS
2.5000 mg | ORAL_TABLET | Freq: Once | ORAL | Status: AC
Start: 2023-04-05 — End: 2023-04-05
  Administered 2023-04-05: 2.5 mg via ORAL
  Filled 2023-04-05: qty 1

## 2023-04-05 MED ORDER — APIXABAN 2.5 MG PO TABS: 2.5000 mg | ORAL_TABLET | Freq: Two times a day (BID) | ORAL | 0 refills | Status: DC

## 2023-04-05 NOTE — ED Provider Notes (Signed)
 [facility] AT [facility] Provider Note   CSN: [id number] Arrival date & time: 04/05/23  6213     History  Chief Complaint  Patient presents with   Sent by Dr, Clayborne Dana    [name] is a 89 y.o. female.  HPI 89 year old female presents with afib.  She has been having fatigue for the past 2-3 weeks ever since having COVID.  However this morning shortly after waking up she felt lightheaded and more fatigued.  She already had a scheduled visit with her doctor for memory testing and when she was in the office they noted tachycardia and A-fib.  She has never had a history of A-fib and so they sent her here.  She denies any chest pain, shortness of breath, focal weakness.  She is feeling better currently.  No lower extremity edema.  No history of GI bleeding or stroke.  Home Medications Prior to Admission medications   Medication Sig Start Date End Date Taking? Authorizing Provider  apixaban (ELIQUIS) 2.5 MG TABS tablet Take 1 tablet (2.5 mg total) by mouth 2 (two) times daily. 04/05/23 05/05/23 Yes Pricilla Loveless, MD      Allergies    Patient has no known allergies.    Review of Systems   Review of Systems  Constitutional:  Positive for fatigue.  Respiratory:  Negative for shortness of breath.   Cardiovascular:  Negative for chest pain, palpitations and leg swelling.  Neurological:  Positive for dizziness.    Physical Exam Updated Vital Signs BP (!) 115/54   Pulse 73   Temp 97.8 F (36.6 C)   Resp 17   Ht 5\' 3"  (1.6 m)   Wt 79.4 kg   SpO2 97%   BMI 31.00 kg/m  Physical Exam Vitals and nursing note reviewed.  Constitutional:      General: She is not in acute distress.    Appearance: She is well-developed. She is not ill-appearing or diaphoretic.  HENT:     Head: Normocephalic and atraumatic.  Cardiovascular:     Rate and Rhythm: Normal rate and regular rhythm.     Heart sounds: Normal heart sounds.  Pulmonary:     Effort:  Pulmonary effort is normal.     Breath sounds: Normal breath sounds.  Abdominal:     Palpations: Abdomen is soft.     Tenderness: There is no abdominal tenderness.  Skin:    General: Skin is warm and dry.  Neurological:     Mental Status: She is alert.     Comments: 5/5 strength in all 4 extremities. Clear speech, no facial droop.     ED Results / Procedures / Treatments   Labs (all labs ordered are listed, but only abnormal results are displayed) Labs Reviewed  BASIC METABOLIC PANEL - Abnormal; Notable for the following components:      Result Value   Glucose, Bld 118 (*)    Creatinine, Ser 1.18 (*)    GFR, Estimated 42 (*)    All other components within normal limits  CBC  MAGNESIUM  TSH  BRAIN NATRIURETIC PEPTIDE  TROPONIN I (HIGH SENSITIVITY)  TROPONIN I (HIGH SENSITIVITY)    EKG EKG Interpretation Date/Time:  Thursday April 05 2023 11:28:09 EDT Ventricular Rate:  86 PR Interval:  203 QRS Duration:  130 QT Interval:  365 QTC Calculation: 437 R Axis:   -70  Text Interpretation: Sinus rhythm RBBB and LAFB Afib no longer present Confirmed by Pricilla Loveless [phone]) on  04/05/2023 11:42:04 AM  Radiology DG Chest 2 View Result Date: 04/05/2023 CLINICAL DATA:  A-Fib. EXAM: CHEST - 2 VIEW COMPARISON:  None Available. FINDINGS: Bilateral lung fields are clear. Bilateral costophrenic angles are clear. Normal cardio-mediastinal silhouette. No acute osseous abnormalities. The soft tissues are within normal limits. IMPRESSION: No active cardiopulmonary disease. Electronically Signed   By: Jules Schick M.D.   On: 04/05/2023 11:26    Procedures Procedures    Medications Ordered in ED Medications  apixaban (ELIQUIS) tablet 2.5 mg (2.5 mg Oral Given 04/05/23 1456)    ED Course/ Medical Decision Making/ A&P         CHA2DS2-VASc Score: 4                        Medical Decision Making Amount and/or Complexity of Data Reviewed External Data Reviewed: notes. Labs:  ordered.    Details: Normal troponin x 2.  Unremarkable electrolytes. Radiology: ordered.    Details: No CHF ECG/medicine tests: ordered and independent interpretation performed.    Details: A-fib with RVR, repeat EKG shows sinus rhythm.  Risk Prescription drug management.   Patient presents with what sounds like paroxysmal A-fib.  She is no longer in A-fib by the time I am seeing her and appears comfortable.  Troponins are negative.  I suspect all of her symptoms this morning were from paroxysmal A-fib.  Other lab work is unremarkable.  Discussed options with patient and daughter, she does not have a significant risk for bleeding or previous bleeding history and so we will put her on Eliquis.  Technically based on her creatinine and age and weight she would be a 5 mg candidate, though her GFR is relatively low in the 40s and after discussing with pharmacy and then later patient and daughter, we will do the 2.5 mg dosing.  He has remained in sinus rhythm and appears stable for discharge with outpatient follow-up with cardiology.        Final Clinical Impression(s) / ED Diagnoses Final diagnoses:  Paroxysmal atrial fibrillation [facility])    Rx / DC Orders ED Discharge Orders          Ordered    Amb Referral to AFIB Clinic        04/05/23 1448    apixaban (ELIQUIS) 2.5 MG TABS tablet  2 times daily        04/05/23 1448              Pricilla Loveless, MD 04/05/23 1503

## 2023-04-05 NOTE — ED Provider Triage Note (Signed)
 Emergency Medicine Provider Triage Evaluation Note  [name] , a 89 y.o. female  was evaluated in triage.  Pt complains of tachycardia and arrhythmia sent by PCP.  Review of Systems  Positive: Recent COVID several weeks ago and fatigue ever since Negative: Chest pain, palpitations, shortness of breath, nausea, vomiting, constipation, diarrhea.  Fevers, chills, cough  Physical Exam  BP (!) 152/97 (BP Location: Left Arm)   Pulse (!) 140   Temp 97.8 F (36.6 C)   Resp 15   Ht 5\' 3"  (1.6 m)   Wt 79.4 kg   SpO2 98%   BMI 31.00 kg/m  Gen:   Awake, no distress   Resp:  Normal effort  MSK:   Moves extremities without difficulty  Other:  Fast heart rate on palpation.  No murmur.  Patient resting comfortably without acute distress  Medical Decision Making  Medically screening exam initiated at 10:48 AM.  Appropriate orders placed.  [name] was informed that the remainder of the evaluation will be completed by another provider, this initial triage assessment does not replace that evaluation, and the importance of remaining in the ED until their evaluation is complete.  [name] is a 89 y.o. female with a past medical history significant for hypertension, hypothyroidism, osteoporosis, and recent diagnosis of COVID-19 several weeks ago who presents from her PCP office with concern for new A-fib with RVR and fatigue.  According to patient, ever since COVID she has been having some fatigue but has reported resolution of cough and URI symptoms.  She has had some very mild memory loss over months and years and show she had a follow-up with her PCP today and was found to have a heart rate in the 150s with A-fib with RVR.  She has never had a history of arrhythmia and denies any history of heart disease.  She reports she is having no symptoms from this other than the fatigue.  She denies any palpitations, chest pain, shortness of breath, nausea, vomiting, diaphoresis, or feeling bad.  She  does not take blood thinners and has no history of this.  On my exam, patient is sitting and resting comfortably without acute distress.  Lungs are clear.  Chest is nontender.  Abdomen nontender.  She has a very fast pulse that is irregular on exam but otherwise is sitting comfortably.  Legs are nontender and not critically edematous.  Patient has good oxygen saturations and is not breathing heavily.  She is afebrile.  EKG does show A-fib with RVR.  As patient is not on blood thinners and there is not a clear start of this, I suspect she may have been in A-fib or even A-fib with RVR since her COVID infection several weeks ago when the fatigue began.  She will get screening labs to look for electrolyte disturbance or other critical abnormalities.  As she may have been in A-fib with RVR for quite some time, we will get a BNP and a troponin.  Will check TSH to make sure that is not contributing given her thyroid history.  Anticipate she may need admission when workup is completed.  She will likely also need cardiology involvement.  Anticipate further evaluation when she gets to a room.  Is well-appearing at this time.      [name], Canary Brim, MD 04/05/23 1102

## 2023-04-05 NOTE — Discharge Instructions (Signed)
 We are referring you to the atrial fibrillation clinic.  We are putting you on a new medicine called Eliquis which helps prevent stroke from atrial fibrillation.  This can cause traumatic and/or spontaneous bleeding and you may NOT take aspirin or NSAIDs such as ibuprofen, Advil, Aleve, etc.  Tylenol is okay to take.  Follow-up with the cardiologist as well as your primary care physician.  If you develop new or worsening symptoms return to the ER.

## 2023-04-05 NOTE — ED Triage Notes (Signed)
 Pt came in via POV d/t her provider encouraging her to come in for evaluation d/t A-Fib while she was in their office this morning. Pt denies pain.

## 2023-04-16 ENCOUNTER — Ambulatory Visit (HOSPITAL_COMMUNITY)
Admission: RE | Admit: 2023-04-16 | Discharge: 2023-04-16 | Disposition: A | Discharge status: Home / Self Care | Source: Ambulatory Visit | Attending: Physician Assistant | Admitting: Physician Assistant

## 2023-04-16 ENCOUNTER — Encounter (HOSPITAL_COMMUNITY): Payer: Self-pay | Admitting: Physician Assistant

## 2023-04-16 VITALS — BP 150/80 | HR 85 | Ht 63.0 in | Wt 179.0 lb

## 2023-04-16 DIAGNOSIS — I10 Essential (primary) hypertension: Secondary | ICD-10-CM | POA: Diagnosis not present

## 2023-04-16 DIAGNOSIS — M810 Age-related osteoporosis without current pathological fracture: Secondary | ICD-10-CM | POA: Insufficient documentation

## 2023-04-16 DIAGNOSIS — I480 Paroxysmal atrial fibrillation: Secondary | ICD-10-CM | POA: Diagnosis not present

## 2023-04-16 DIAGNOSIS — D6869 Other thrombophilia: Secondary | ICD-10-CM

## 2023-04-16 DIAGNOSIS — Z7901 Long term (current) use of anticoagulants: Secondary | ICD-10-CM | POA: Diagnosis not present

## 2023-04-16 DIAGNOSIS — Z8616 Personal history of COVID-19: Secondary | ICD-10-CM | POA: Diagnosis not present

## 2023-04-16 MED ORDER — APIXABAN 5 MG PO TABS: 5.0000 mg | ORAL_TABLET | Freq: Two times a day (BID) | ORAL | 4 refills | Status: DC

## 2023-04-16 NOTE — Progress Notes (Signed)
 Primary Care Physician: Rodrigo Ran, MD Primary Cardiologist: None Electrophysiologist: None  Referring Physician: ED   [name] is a 89 y.o. female with a history of HTN, osteoporosis, atrial fibrillation who presents for consultation in the [facility] At[facility] Fibr[facility].  The patient was initially diagnosed with atrial fibrillation 04/05/23. She was sent to the ED from another physician office after they noted tachycardia. Patient had felt lightheaded and fatigued that morning. She had COVID infection 2-3 weeks prior. Patient spontaneously converted to SR in the ED. She was started on Eliquis for stroke prevention.   Today, patient reports that she feels well. She is in SR today. She denies significant snoring or alcohol use. No bleeding issues on anticoagulation.   Today, she denies symptoms of palpitations, chest pain, shortness of breath, orthopnea, PND, lower extremity edema, dizziness, presyncope, syncope, snoring, daytime somnolence, bleeding, or neurologic sequela. The patient is tolerating medications without difficulties and is otherwise without complaint today.    Atrial Fibrillation Risk Factors:  she does not have symptoms or diagnosis of sleep apnea. she does not have a history of rheumatic fever. she does not have a history of alcohol use. The patient does not have a history of early familial atrial fibrillation or other arrhythmias.  Atrial Fibrillation Management history:  Previous antiarrhythmic drugs: none Previous cardioversions: none Previous ablations: none Anticoagulation history: Eliquis  ROS- All systems are reviewed and negative except as per the HPI above.  Past Medical History:  Diagnosis Date   Hypertension    Osteoporosis     Current Outpatient Medications  Medication Sig Dispense Refill   acetaminophen (TYLENOL 8 HOUR ARTHRITIS PAIN) 650 MG CR tablet Take 650 mg by mouth every 8 (eight) hours as needed for pain.     apixaban  (ELIQUIS) 2.5 MG TABS tablet Take 1 tablet (2.5 mg total) by mouth 2 (two) times daily. 60 tablet 0   atorvastatin (LIPITOR) 10 MG tablet Take 10 mg by mouth daily.     Cholecalciferol 25 MCG (1000 UT) capsule Take 1,000 Units by mouth daily.     denosumab (PROLIA) 60 MG/ML SOSY injection Inject 60 mg into the skin every 6 (six) months.     Loratadine (CLARITIN) 10 MG CAPS Take 10 mg by mouth daily as needed (allergies).     losartan (COZAAR) 50 MG tablet Take 50 mg by mouth 2 (two) times daily.     No current facility-administered medications for this encounter.    Physical Exam: BP (!) 150/80   Pulse 85   Ht 5\' 3"  (1.6 m)   Wt 81.2 kg   BMI 31.71 kg/m   GEN: Well nourished, well developed in no acute distress NECK: No JVD; No carotid bruits CARDIAC: Regular rate and rhythm, no murmurs, rubs, gallops RESPIRATORY:  Clear to auscultation without rales, wheezing or rhonchi  ABDOMEN: Soft, non-tender, non-distended EXTREMITIES:  No edema; No deformity   Wt Readings from Last 3 Encounters:  04/16/23 81.2 kg  04/05/23 79.4 kg  03/19/18 77.1 kg     EKG today demonstrates  SR, inc RBBB Vent. rate 85 BPM PR interval 186 ms QRS duration 118 ms QT/QTcB 378/449 ms    CHA2DS2-VASc Score = 4  The patient's score is based upon: CHF History: 0 HTN History: 1 Diabetes History: 0 Stroke History: 0 Vascular Disease History: 0 Age Score: 2 Gender Score: 1       ASSESSMENT AND PLAN: Paroxysmal Atrial Fibrillation (ICD10:  I48.0) The patient's CHA2DS2-VASc  score is 4, indicating a 4.8% annual risk of stroke.   She is in SR today.  General education about afib provided and questions answered. We also discussed her stroke risk and the risks and benefits of anticoagulation. Based on her age, weight, and renal function she qualifies for the 5 mg dose of Eliquis. Will increase to Eliquis 5 mg BID.  We also discussed wearing a cardiac monitor to assess burden and checking an  echocardiogram. She would prefer a very conservative approach given her advanced age and has deferred further testing for now.   Secondary Hypercoagulable State (ICD10:  D68.69) The patient is at significant risk for stroke/thromboembolism based upon her CHA2DS2-VASc Score of 4.  Continue Apixaban (Eliquis). No bleeding issues.  HTN Stable on current regimen   Follow up in the AF clinic in 3 months.        [name] PA-C Afib Clinic [facility] [address] [place], [place] [address] [phone]

## 2023-04-16 NOTE — Patient Instructions (Signed)
 Start Eliquis 5mg  twice a day

## 2023-05-14 DIAGNOSIS — H6123 Impacted cerumen, bilateral: Secondary | ICD-10-CM | POA: Diagnosis not present

## 2023-06-29 ENCOUNTER — Other Ambulatory Visit (HOSPITAL_COMMUNITY): Payer: Self-pay | Admitting: *Deleted

## 2023-07-02 ENCOUNTER — Encounter (HOSPITAL_COMMUNITY)
Admission: RE | Admit: 2023-07-02 | Discharge: 2023-07-02 | Disposition: A | Discharge status: Home / Self Care | Source: Ambulatory Visit | Attending: Internal Medicine | Admitting: Internal Medicine

## 2023-07-02 DIAGNOSIS — M810 Age-related osteoporosis without current pathological fracture: Secondary | ICD-10-CM | POA: Diagnosis not present

## 2023-07-02 MED ORDER — DENOSUMAB 60 MG/ML ~~LOC~~ SOSY
PREFILLED_SYRINGE | SUBCUTANEOUS | Status: AC
Start: 2023-07-02 — End: 2023-07-02
  Filled 2023-07-02: qty 1

## 2023-07-02 MED ORDER — DENOSUMAB 60 MG/ML ~~LOC~~ SOSY: 60.0000 mg | PREFILLED_SYRINGE | Freq: Once | SUBCUTANEOUS | Status: AC

## 2023-07-03 ENCOUNTER — Ambulatory Visit (HOSPITAL_COMMUNITY)
Admission: RE | Admit: 2023-07-03 | Discharge: 2023-07-03 | Disposition: A | Discharge status: Home / Self Care | Source: Ambulatory Visit | Attending: Physician Assistant | Admitting: Physician Assistant

## 2023-07-03 ENCOUNTER — Encounter (HOSPITAL_COMMUNITY): Payer: Self-pay | Admitting: Physician Assistant

## 2023-07-03 VITALS — BP 146/80 | HR 83 | Ht 63.0 in | Wt 181.6 lb

## 2023-07-03 DIAGNOSIS — M810 Age-related osteoporosis without current pathological fracture: Secondary | ICD-10-CM | POA: Insufficient documentation

## 2023-07-03 DIAGNOSIS — Z7901 Long term (current) use of anticoagulants: Secondary | ICD-10-CM | POA: Diagnosis not present

## 2023-07-03 DIAGNOSIS — I480 Paroxysmal atrial fibrillation: Secondary | ICD-10-CM | POA: Insufficient documentation

## 2023-07-03 DIAGNOSIS — D6869 Other thrombophilia: Secondary | ICD-10-CM | POA: Diagnosis not present

## 2023-07-03 DIAGNOSIS — Z79899 Other long term (current) drug therapy: Secondary | ICD-10-CM | POA: Diagnosis not present

## 2023-07-03 DIAGNOSIS — I10 Essential (primary) hypertension: Secondary | ICD-10-CM | POA: Insufficient documentation

## 2023-07-03 DIAGNOSIS — Z8616 Personal history of COVID-19: Secondary | ICD-10-CM | POA: Diagnosis not present

## 2023-07-03 NOTE — Progress Notes (Signed)
 Primary Care Physician: Aldo Hun, MD Primary Cardiologist: None Electrophysiologist: None  Referring Physician: ED   [name] is a 89 y.o. female with a history of HTN, osteoporosis, atrial fibrillation who presents for follow up in the [facility] Atrial Fibrillation [facility].  The patient was initially diagnosed with atrial fibrillation 04/05/23. She was sent to the ED from another physician office after they noted tachycardia. Patient had felt lightheaded and fatigued that morning. She had COVID infection 2-3 weeks prior. Patient spontaneously converted to SR in the ED. She was started on Eliquis  for stroke prevention.   Patient returns for follow up for atrial fibrillation. She remains in SR today. She denies any palpitations, dizziness, or SOB. No bleeding issues on anticoagulation.   Today, she  denies symptoms of palpitations, chest pain, shortness of breath, orthopnea, PND, lower extremity edema, dizziness, presyncope, syncope, snoring, daytime somnolence, bleeding, or neurologic sequela. The patient is tolerating medications without difficulties and is otherwise without complaint today.    Atrial Fibrillation Risk Factors:  she does not have symptoms or diagnosis of sleep apnea. she does not have a history of rheumatic fever. she does not have a history of alcohol  use. The patient does not have a history of early familial atrial fibrillation or other arrhythmias.  Atrial Fibrillation Management history:  Previous antiarrhythmic drugs: none Previous cardioversions: none Previous ablations: none Anticoagulation history: Eliquis   ROS- All systems are reviewed and negative except as per the HPI above.  Past Medical History:  Diagnosis Date   Hypertension    Osteoporosis     Current Outpatient Medications  Medication Sig Dispense Refill   acetaminophen (TYLENOL 8 HOUR ARTHRITIS PAIN) 650 MG CR tablet Take 650 mg by mouth every 8 (eight) hours as needed for pain.      apixaban  (ELIQUIS ) 5 MG TABS tablet Take 1 tablet (5 mg total) by mouth 2 (two) times daily. 60 tablet 4   atorvastatin (LIPITOR) 10 MG tablet Take 10 mg by mouth daily.     Cholecalciferol 25 MCG (1000 UT) capsule Take 1,000 Units by mouth daily.     denosumab  (PROLIA ) 60 MG/ML SOSY injection Inject 60 mg into the skin every 6 (six) months.     Loratadine (CLARITIN) 10 MG CAPS Take 10 mg by mouth daily as needed (allergies).     losartan (COZAAR) 50 MG tablet Take 50 mg by mouth 2 (two) times daily.     No current facility-administered medications for this encounter.    Physical Exam: BP (!) 146/80   Pulse 83   Ht 5' 3 (1.6 m)   Wt 82.4 kg   BMI 32.17 kg/m   GEN: Well nourished, well developed in no acute distress CARDIAC: Regular rate and rhythm, no murmurs, rubs, gallops RESPIRATORY:  Clear to auscultation without rales, wheezing or rhonchi  ABDOMEN: Soft, non-tender, non-distended EXTREMITIES:  No edema; No deformity    Wt Readings from Last 3 Encounters:  07/03/23 82.4 kg  04/16/23 81.2 kg  04/05/23 79.4 kg     EKG today demonstrates  SR, RBBB Vent. rate 83 BPM PR interval 198 ms QRS duration 120 ms QT/QTcB 380/446 ms   CHA2DS2-VASc Score = 4  The patient's score is based upon: CHF History: 0 HTN History: 1 Diabetes History: 0 Stroke History: 0 Vascular Disease History: 0 Age Score: 2 Gender Score: 1       ASSESSMENT AND PLAN: Paroxysmal Atrial Fibrillation (ICD10:  I48.0) The patient's CHA2DS2-VASc score is 4, indicating  a 4.8% annual risk of stroke.   Patient appears to be maintaining SR. Recall patient has deferred cardiac testing and wishes to pursue a very conservative strategy for her afib.  Continue Eliquis  5 mg BID (weight >60 kg, Cr < 1.5)  Secondary Hypercoagulable State (ICD10:  D68.69) The patient is at significant risk for stroke/thromboembolism based upon her CHA2DS2-VASc Score of 4.  Continue Apixaban  (Eliquis ). No bleeding issues.    HTN Stable on current regimen   Follow up in the AF clinic as needed.       Myrtha Ates PA-C Afib Clinic [facility] [address] [place], [place] [address] [phone]

## 2023-09-19 DIAGNOSIS — H353213 Exudative age-related macular degeneration, right eye, with inactive scar: Secondary | ICD-10-CM | POA: Diagnosis not present

## 2023-09-19 DIAGNOSIS — H35371 Puckering of macula, right eye: Secondary | ICD-10-CM | POA: Diagnosis not present

## 2023-09-19 DIAGNOSIS — H353221 Exudative age-related macular degeneration, left eye, with active choroidal neovascularization: Secondary | ICD-10-CM | POA: Diagnosis not present

## 2023-09-19 DIAGNOSIS — Z961 Presence of intraocular lens: Secondary | ICD-10-CM | POA: Diagnosis not present

## 2023-09-19 DIAGNOSIS — H43813 Vitreous degeneration, bilateral: Secondary | ICD-10-CM | POA: Diagnosis not present

## 2023-10-06 ENCOUNTER — Other Ambulatory Visit (HOSPITAL_COMMUNITY): Payer: Self-pay | Admitting: Physician Assistant

## 2023-11-15 DIAGNOSIS — Z23 Encounter for immunization: Secondary | ICD-10-CM | POA: Diagnosis not present

## 2023-11-15 DIAGNOSIS — E785 Hyperlipidemia, unspecified: Secondary | ICD-10-CM | POA: Diagnosis not present

## 2023-11-15 DIAGNOSIS — E669 Obesity, unspecified: Secondary | ICD-10-CM | POA: Diagnosis not present

## 2023-11-15 DIAGNOSIS — M109 Gout, unspecified: Secondary | ICD-10-CM | POA: Diagnosis not present

## 2023-11-15 DIAGNOSIS — F439 Reaction to severe stress, unspecified: Secondary | ICD-10-CM | POA: Diagnosis not present

## 2023-11-15 DIAGNOSIS — M810 Age-related osteoporosis without current pathological fracture: Secondary | ICD-10-CM | POA: Diagnosis not present

## 2023-11-15 DIAGNOSIS — H6123 Impacted cerumen, bilateral: Secondary | ICD-10-CM | POA: Diagnosis not present

## 2023-11-15 DIAGNOSIS — I4891 Unspecified atrial fibrillation: Secondary | ICD-10-CM | POA: Diagnosis not present

## 2023-11-15 DIAGNOSIS — R413 Other amnesia: Secondary | ICD-10-CM | POA: Diagnosis not present

## 2023-11-15 DIAGNOSIS — I10 Essential (primary) hypertension: Secondary | ICD-10-CM | POA: Diagnosis not present

## 2023-11-15 DIAGNOSIS — R7301 Impaired fasting glucose: Secondary | ICD-10-CM | POA: Diagnosis not present

## 2023-11-20 ENCOUNTER — Other Ambulatory Visit (HOSPITAL_COMMUNITY): Payer: Self-pay | Admitting: Internal Medicine

## 2023-11-20 ENCOUNTER — Telehealth (HOSPITAL_COMMUNITY): Payer: Self-pay | Admitting: Pharmacy Technician

## 2023-11-20 DIAGNOSIS — M810 Age-related osteoporosis without current pathological fracture: Secondary | ICD-10-CM | POA: Insufficient documentation

## 2023-11-20 NOTE — Telephone Encounter (Signed)
 Auth Submission: NO AUTH NEEDED Site of care: [facility] Payer: Medicare A/B, BCBS Supp   Medication & CPT/J Code(s) submitted: Prolia  (Denosumab ) [id number] Diagnosis Code: M81.0 Route of submission (phone, fax, portal):  Phone # Fax # Auth type: Buy/Bill HB Units/visits requested: 60mg  x 2 doses, q 6 months Reference number:  Approval from: 11/20/2023 to 02/16/24    Dagoberto Armour, CPhT [facility] Phone: [phone] 11/20/2023

## 2023-12-04 ENCOUNTER — Other Ambulatory Visit (HOSPITAL_COMMUNITY): Payer: Self-pay | Admitting: Physician Assistant

## 2023-12-19 ENCOUNTER — Other Ambulatory Visit: Payer: Self-pay

## 2023-12-19 ENCOUNTER — Encounter (HOSPITAL_COMMUNITY): Payer: Self-pay | Admitting: Hospitalist

## 2023-12-19 ENCOUNTER — Inpatient Hospital Stay (HOSPITAL_COMMUNITY)
Admission: EM | Admit: 2023-12-19 | Discharge: 2023-12-25 | DRG: 308 | Disposition: A | Discharge status: Skilled Nursing Facility | Attending: Hospitalist | Admitting: Hospitalist

## 2023-12-19 ENCOUNTER — Emergency Department (HOSPITAL_COMMUNITY)

## 2023-12-19 DIAGNOSIS — N1832 Chronic kidney disease, stage 3b: Secondary | ICD-10-CM | POA: Diagnosis present

## 2023-12-19 DIAGNOSIS — I129 Hypertensive chronic kidney disease with stage 1 through stage 4 chronic kidney disease, or unspecified chronic kidney disease: Secondary | ICD-10-CM | POA: Diagnosis present

## 2023-12-19 DIAGNOSIS — I10 Essential (primary) hypertension: Secondary | ICD-10-CM | POA: Diagnosis not present

## 2023-12-19 DIAGNOSIS — R000 Tachycardia, unspecified: Secondary | ICD-10-CM | POA: Diagnosis not present

## 2023-12-19 DIAGNOSIS — R0602 Shortness of breath: Secondary | ICD-10-CM | POA: Diagnosis not present

## 2023-12-19 DIAGNOSIS — Z7901 Long term (current) use of anticoagulants: Secondary | ICD-10-CM | POA: Diagnosis not present

## 2023-12-19 DIAGNOSIS — R918 Other nonspecific abnormal finding of lung field: Secondary | ICD-10-CM | POA: Diagnosis not present

## 2023-12-19 DIAGNOSIS — J154 Pneumonia due to other streptococci: Secondary | ICD-10-CM | POA: Diagnosis present

## 2023-12-19 DIAGNOSIS — I4891 Unspecified atrial fibrillation: Secondary | ICD-10-CM | POA: Diagnosis not present

## 2023-12-19 DIAGNOSIS — I499 Cardiac arrhythmia, unspecified: Secondary | ICD-10-CM | POA: Diagnosis not present

## 2023-12-19 DIAGNOSIS — Z8249 Family history of ischemic heart disease and other diseases of the circulatory system: Secondary | ICD-10-CM | POA: Diagnosis not present

## 2023-12-19 DIAGNOSIS — I4510 Unspecified right bundle-branch block: Secondary | ICD-10-CM | POA: Diagnosis present

## 2023-12-19 DIAGNOSIS — I480 Paroxysmal atrial fibrillation: Secondary | ICD-10-CM | POA: Diagnosis present

## 2023-12-19 DIAGNOSIS — R079 Chest pain, unspecified: Secondary | ICD-10-CM | POA: Diagnosis not present

## 2023-12-19 DIAGNOSIS — Z1152 Encounter for screening for COVID-19: Secondary | ICD-10-CM | POA: Diagnosis not present

## 2023-12-19 DIAGNOSIS — H9190 Unspecified hearing loss, unspecified ear: Secondary | ICD-10-CM | POA: Diagnosis present

## 2023-12-19 DIAGNOSIS — J189 Pneumonia, unspecified organism: Secondary | ICD-10-CM

## 2023-12-19 DIAGNOSIS — M810 Age-related osteoporosis without current pathological fracture: Secondary | ICD-10-CM | POA: Diagnosis present

## 2023-12-19 DIAGNOSIS — I517 Cardiomegaly: Secondary | ICD-10-CM | POA: Diagnosis not present

## 2023-12-19 DIAGNOSIS — Z8616 Personal history of COVID-19: Secondary | ICD-10-CM | POA: Diagnosis not present

## 2023-12-19 DIAGNOSIS — J90 Pleural effusion, not elsewhere classified: Secondary | ICD-10-CM | POA: Diagnosis not present

## 2023-12-19 DIAGNOSIS — Z79899 Other long term (current) drug therapy: Secondary | ICD-10-CM | POA: Diagnosis not present

## 2023-12-19 LAB — I-STAT CHEM 8, ED
BUN: 28 mg/dL — ABNORMAL HIGH (ref 8–23)
Calcium, Ion: 1.33 mmol/L (ref 1.15–1.40)
Chloride: 103 mmol/L (ref 98–111)
Creatinine, Ser: 1.3 mg/dL — ABNORMAL HIGH (ref 0.44–1.00)
Glucose, Bld: 168 mg/dL — ABNORMAL HIGH (ref 70–99)
HCT: 38 % (ref 36.0–46.0)
Hemoglobin: 12.9 g/dL (ref 12.0–15.0)
Potassium: 4.4 mmol/L (ref 3.5–5.1)
Sodium: 140 mmol/L (ref 135–145)
TCO2: 24 mmol/L (ref 22–32)

## 2023-12-19 LAB — URINALYSIS, W/ REFLEX TO CULTURE (INFECTION SUSPECTED)
Bilirubin Urine: NEGATIVE
Glucose, UA: NEGATIVE mg/dL
Ketones, ur: NEGATIVE mg/dL
Nitrite: NEGATIVE
Protein, ur: NEGATIVE mg/dL
RBC / HPF: >50 RBC/hpf (ref 0–5)
Specific Gravity, Urine: 1.013 (ref 1.005–1.030)
WBC, UA: >50 WBC/hpf (ref 0–5)
pH: 6 (ref 5.0–8.0)

## 2023-12-19 LAB — CBC WITH DIFFERENTIAL/PLATELET
Abs Immature Granulocytes: 0.38 K/uL — ABNORMAL HIGH (ref 0.00–0.07)
Basophils Absolute: 0.1 K/uL (ref 0.0–0.1)
Basophils Relative: 1 %
Eosinophils Absolute: 0 K/uL (ref 0.0–0.5)
Eosinophils Relative: 0 %
HCT: 36.9 % (ref 36.0–46.0)
Hemoglobin: 11.6 g/dL — ABNORMAL LOW (ref 12.0–15.0)
Immature Granulocytes: 3 %
Lymphocytes Relative: 5 %
Lymphs Abs: 0.8 K/uL (ref 0.7–4.0)
MCH: 27.2 pg (ref 26.0–34.0)
MCHC: 31.4 g/dL (ref 30.0–36.0)
MCV: 86.4 fL (ref 80.0–100.0)
Monocytes Absolute: 0.4 K/uL (ref 0.1–1.0)
Monocytes Relative: 3 %
Neutro Abs: 12.4 K/uL — ABNORMAL HIGH (ref 1.7–7.7)
Neutrophils Relative %: 88 %
Platelets: 808 K/uL — ABNORMAL HIGH (ref 150–400)
RBC: 4.27 MIL/uL (ref 3.87–5.11)
RDW: 14.9 % (ref 11.5–15.5)
WBC: 14.1 K/uL — ABNORMAL HIGH (ref 4.0–10.5)
nRBC: 0 % (ref 0.0–0.2)

## 2023-12-19 LAB — RESP PANEL BY RT-PCR (RSV, FLU A&B, COVID)  RVPGX2
Influenza A by PCR: NEGATIVE
Influenza B by PCR: NEGATIVE
Resp Syncytial Virus by PCR: NEGATIVE
SARS Coronavirus 2 by RT PCR: NEGATIVE

## 2023-12-19 LAB — I-STAT CG4 LACTIC ACID, ED
Lactic Acid, Venous: 1.9 mmol/L (ref 0.5–1.9)
Lactic Acid, Venous: 1.3 mmol/L (ref 0.5–1.9)

## 2023-12-19 LAB — COMPREHENSIVE METABOLIC PANEL WITH GFR
ALT: 36 U/L (ref 0–44)
AST: 48 U/L — ABNORMAL HIGH (ref 15–41)
Albumin: 2.8 g/dL — ABNORMAL LOW (ref 3.5–5.0)
Alkaline Phosphatase: 62 U/L (ref 38–126)
Anion gap: 11 (ref 5–15)
BUN: 25 mg/dL — ABNORMAL HIGH (ref 8–23)
CO2: 25 mmol/L (ref 22–32)
Calcium: 9.7 mg/dL (ref 8.9–10.3)
Chloride: 103 mmol/L (ref 98–111)
Creatinine, Ser: 1.31 mg/dL — ABNORMAL HIGH (ref 0.44–1.00)
GFR, Estimated: 37 mL/min — ABNORMAL LOW (ref >60)
Glucose, Bld: 173 mg/dL — ABNORMAL HIGH (ref 70–99)
Potassium: 4.5 mmol/L (ref 3.5–5.1)
Sodium: 139 mmol/L (ref 135–145)
Total Bilirubin: 1 mg/dL (ref 0.0–1.2)
Total Protein: 5.8 g/dL — ABNORMAL LOW (ref 6.5–8.1)

## 2023-12-19 LAB — BRAIN NATRIURETIC PEPTIDE: B Natriuretic Peptide: 305.2 pg/mL — ABNORMAL HIGH (ref 0.0–100.0)

## 2023-12-19 LAB — MAGNESIUM: Magnesium: 1.6 mg/dL — ABNORMAL LOW (ref 1.7–2.4)

## 2023-12-19 LAB — TROPONIN I (HIGH SENSITIVITY)
Troponin I (High Sensitivity): 14 ng/L (ref <18)
Troponin I (High Sensitivity): 12 ng/L (ref <18)

## 2023-12-19 MED ORDER — ATORVASTATIN CALCIUM 10 MG PO TABS
10.0000 mg | ORAL_TABLET | Freq: Every day | ORAL | Status: DC
  Administered 2023-12-19 – 2023-12-25 (×7): 10 mg via ORAL
  Filled 2023-12-19 (×7): qty 1

## 2023-12-19 MED ORDER — MAGNESIUM OXIDE -MG SUPPLEMENT 400 (240 MG) MG PO TABS
400.0000 mg | ORAL_TABLET | Freq: Every day | ORAL | Status: DC
  Administered 2023-12-20 – 2023-12-25 (×6): 400 mg via ORAL
  Filled 2023-12-19 (×6): qty 1

## 2023-12-19 MED ORDER — SODIUM CHLORIDE 0.9 % IV SOLN
1.0000 g | INTRAVENOUS | Status: AC
  Administered 2023-12-20 – 2023-12-23 (×4): 1 g via INTRAVENOUS
  Filled 2023-12-19 (×4): qty 10

## 2023-12-19 MED ORDER — DILTIAZEM HCL-DEXTROSE 125-5 MG/125ML-% IV SOLN (PREMIX)
5.0000 mg/h | INTRAVENOUS | Status: AC
  Administered 2023-12-19: 5 mg/h via INTRAVENOUS
  Administered 2023-12-19 – 2023-12-20 (×3): 10 mg/h via INTRAVENOUS
  Filled 2023-12-19 (×6): qty 125

## 2023-12-19 MED ORDER — SODIUM CHLORIDE 0.9 % IV SOLN
2.0000 g | Freq: Once | INTRAVENOUS | Status: AC
  Administered 2023-12-19: 2 g via INTRAVENOUS
  Filled 2023-12-19: qty 20

## 2023-12-19 MED ORDER — LOSARTAN POTASSIUM 50 MG PO TABS: 50.0000 mg | ORAL_TABLET | Freq: Two times a day (BID) | ORAL | Status: DC

## 2023-12-19 MED ORDER — SODIUM CHLORIDE 0.9 % IV SOLN
500.0000 mg | Freq: Once | INTRAVENOUS | Status: AC
  Administered 2023-12-19: 500 mg via INTRAVENOUS
  Filled 2023-12-19: qty 5

## 2023-12-19 MED ORDER — AZITHROMYCIN 250 MG PO TABS
500.0000 mg | ORAL_TABLET | Freq: Every day | ORAL | Status: AC
  Administered 2023-12-20 – 2023-12-21 (×2): 500 mg via ORAL
  Filled 2023-12-19 (×2): qty 2

## 2023-12-19 MED ORDER — SODIUM CHLORIDE 0.9 % IV SOLN: Freq: Once | INTRAVENOUS | Status: AC

## 2023-12-19 MED ORDER — APIXABAN 5 MG PO TABS
5.0000 mg | ORAL_TABLET | Freq: Two times a day (BID) | ORAL | Status: DC
  Administered 2023-12-19 – 2023-12-25 (×12): 5 mg via ORAL
  Filled 2023-12-19 (×13): qty 1

## 2023-12-19 MED ORDER — MAGNESIUM SULFATE 2 GM/50ML IV SOLN
2.0000 g | Freq: Once | INTRAVENOUS | Status: AC
  Administered 2023-12-19: 2 g via INTRAVENOUS
  Filled 2023-12-19: qty 50

## 2023-12-19 NOTE — Consult Note (Addendum)
 Cardiology Consultation   Patient ID: [name] MRN: [id number]; DOB: 04-22-1924  Admit date: 12/19/2023 Date of Consult: 12/19/2023  PCP:  Shayne Anes, MD   [facility] Providers Cardiologist:  Afib clinic   Patient Profile: [name] is a 89 y.o. female with a hx of paroxysmal atrial fibrillation, hypertension, osteoporosis who is being seen 12/19/2023 for the evaluation of atrial fibrillation with RVR at the request of Dr. Georgina.  History of Present Illness: Ms. [name] is a 89 year old female with past medical history noted above.  She has been followed in the A-fib clinic and initially diagnosed with atrial fibrillation 03/2023.  Had recently been diagnosed with COVID 2 to 3 weeks prior to that.  Spontaneously converted in the ED was started on Eliquis  for stroke risk prevention.  Seen in the A-fib clinic 06/2023 and was maintaining sinus rhythm.  Wished to pursue conservative strategy regarding A-fib management.  Continued on Eliquis  5 mg twice daily.  Presented to the ED on 12/3 from her PCP office as she was there for a follow-up from a fever and chest pain the week prior.  She was being treated for suspected pneumonia and on Levaquin for 5 days. She was noted to be in atrial fibrillation with RVR with heart rates ranging between 120 and 180.  Given IV Cardizem 20 mg while and route via EMS.  In the ED labs showed sodium 139, potassium 4.5, creatinine 1.3, magnesium 1.6, albumin 2.8, high-sensitivity troponin 14, lactic acid 1.9, WBC 14.1, hemoglobin 11.6.  Respiratory panel negative.  Chest x-ray with cardiomegaly and basilar atelectasis versus early pneumonia and a trace right pleural effusion.  EKG showed atrial fibrillation with RVR, 141 bpm.  Admitted to internal medicine and started on IV antibiotics.  Cardiology asked to evaluate in regards to atrial fibrillation.  In talking with patient and daughter at the bedside, they note that she has been fatigued over the past  couple of weeks, though no palpitations.   Past Medical History:  Diagnosis Date   Hypertension    Osteoporosis     No past surgical history on file.   Scheduled Meds:  apixaban   5 mg Oral BID   atorvastatin  10 mg Oral Daily   [START ON 12/20/2023] azithromycin  500 mg Oral Daily   [START ON 12/20/2023] magnesium oxide  400 mg Oral Daily   Continuous Infusions:  azithromycin (ZITHROMAX) 500 mg in sodium chloride 0.9 % 250 mL IVPB 500 mg (12/19/23 1411)   [START ON 12/20/2023] cefTRIAXone (ROCEPHIN)  IV     diltiazem (CARDIZEM) infusion 15 mg/hr (12/19/23 1302)   magnesium sulfate bolus IVPB     PRN Meds:   Allergies:    Allergies  Allergen Reactions   Estrogens Other (See Comments)   Ezetimibe-Simvastatin     Other Reaction(s): pain in liver region   Mite (D. Farinae) Other (See Comments)   Simvastatin     Other Reaction(s): cramps in legs.    Social History:   Social History   Socioeconomic History   Marital status: Widowed    Spouse name: Not on file   Number of children: Not on file   Years of education: Not on file   Highest education level: Not on file  Occupational History   Not on file  Tobacco Use   Smoking status: Never   Smokeless tobacco: Never   Tobacco comments:    Never smoked 04/16/23  Vaping Use   Vaping status: Never Used  Substance and  Sexual Activity   Alcohol  use: Not Currently   Drug use: Never   Sexual activity: Not on file  Other Topics Concern   Not on file  Social History Narrative   Not on file   Social Drivers of Health   Financial Resource Strain: Not on file  Food Insecurity: Not on file  Transportation Needs: Not on file  Physical Activity: Not on file  Stress: Not on file  Social Connections: Not on file  Intimate Partner Violence: Not on file    Family History:    Family History  Problem Relation Age of Onset   Hypertension Father      ROS:  Please see the history of present illness.   All other ROS  reviewed and negative.     Physical Exam/Data: Vitals:   12/19/23 1200 12/19/23 1215 12/19/23 1230 12/19/23 1307  BP: (!) 129/97 122/88 (!) 138/101 (!) 126/96  Pulse: (!) 133 (!) 139 (!) 140 (!) 150  Resp: (!) 24 (!) 26 18 (!) 23  Temp:      TempSrc:      SpO2: 100% 100% 99% 98%  Weight:      Height:        Intake/Output Summary (Last 24 hours) at 12/19/2023 1448 Last data filed at 12/19/2023 1407 Gross per 24 hour  Intake 100 ml  Output --  Net 100 ml      12/19/2023   11:17 AM 07/03/2023   10:07 AM 04/16/2023    1:48 PM  Last 3 Weights  Weight (lbs) 180 lb 181 lb 9.6 oz 179 lb  Weight (kg) 81.647 kg 82.373 kg 81.194 kg     Body mass index is 31.89 kg/m.  General:  Well nourished, well developed, in no acute distress HEENT: normal Neck: no JVD Vascular: Distal pulses 2+ bilaterally Cardiac:  normal S1, S2; Irreg Irreg/tachycardic; no murmur  Lungs:  clear to auscultation bilaterally, no wheezing, rhonchi or rales  Abd: soft, nontender Ext: no edema Musculoskeletal:  No deformities, BUE and BLE strength normal and equal Skin: warm and dry  Neuro: no focal abnormalities noted Psych:  Normal affect   EKG:  The EKG was personally reviewed and demonstrates:  atrial fibrillation with RVR, 141 bpm Telemetry:  Telemetry was personally reviewed and demonstrates:  Atrial fibrillation rates 120-150s  Relevant CV Studies:  N/a   Laboratory Data: High Sensitivity Troponin:   Recent Labs  Lab 12/19/23 1142 12/19/23 1345  TROPONINIHS 14 12     Chemistry Recent Labs  Lab 12/19/23 1142 12/19/23 1155  NA 139 140  K 4.5 4.4  CL 103 103  CO2 25  --   GLUCOSE 173* 168*  BUN 25* 28*  CREATININE 1.31* 1.30*  CALCIUM 9.7  --   MG 1.6*  --   GFRNONAA 37*  --   ANIONGAP 11  --     Recent Labs  Lab 12/19/23 1142  PROT 5.8*  ALBUMIN 2.8*  AST 48*  ALT 36  ALKPHOS 62  BILITOT 1.0   Lipids No results for input(s): CHOL, TRIG, HDL, LABVLDL, LDLCALC,  CHOLHDL in the last 168 hours.  Hematology Recent Labs  Lab 12/19/23 1142 12/19/23 1155  WBC 14.1*  --   RBC 4.27  --   HGB 11.6* 12.9  HCT 36.9 38.0  MCV 86.4  --   MCH 27.2  --   MCHC 31.4  --   RDW 14.9  --   PLT 808*  --  Thyroid  No results for input(s): TSH, FREET4 in the last 168 hours.  BNPNo results for input(s): BNP, PROBNP in the last 168 hours.  DDimer No results for input(s): DDIMER in the last 168 hours.  Radiology/Studies:  DG Chest Port 1 View Result Date: 12/19/2023 CLINICAL DATA:  Shortness of breath, fever and chest pain, atrial fibrillation EXAM: PORTABLE CHEST 1 VIEW COMPARISON:  04/05/2018 FINDINGS: Mild cardiomegaly and slight ill-defined hazy basilar opacities, favored to be atelectasis. Difficult to exclude early basilar pneumonia. Suspect trace right effusion. Aorta atherosclerotic and tortuous. Degenerative changes of the spine. Bones are osteopenic. Similar rightward tracheal deviation. No acute osseous finding. IMPRESSION: 1. Cardiomegaly with basilar atelectasis versus early pneumonia. 2. Trace right effusion. Electronically Signed   By: CHRISTELLA.  Shick M.D.   On: 12/19/2023 12:04     Assessment and Plan:  Anjuli Catapano is a 89 y.o. female with a hx of paroxysmal atrial fibrillation, hypertension, osteoporosis who is being seen 12/19/2023 for the evaluation of atrial fibrillation with RVR at the request of Dr. Georgina.  Atrial fibrillation with RVR History of paroxysmal A-fib --Noted in the setting of right lower lobe community-acquired pneumonia -- Seen in PCP office today and noted to be in rapid A-fib with rates between 120-180. IV Cardizem 20 mg x 1 via EMS and route.  Rates initially improved, but developed recurrent RVR.  Placed on IV diltiazem @15mg /hr. BP stable. Given PNA, no plans for DCCV at this time, continue IV Diltiazem and transition to PO as appropriate.  -- check echo -- continue Eliquis  5mg  BID  Community-acquired  pneumonia --Has been treated with Levaquin for total of 5 days by PCP, now with worsening symptoms. --IV antibiotics per primary  Hypomagnesemia -- Mg 1.6, suppl  HTN -- home losartan held  Risk Assessment/Risk Scores:  CHA2DS2-VASc Score = 4   This indicates a 4.8% annual risk of stroke. The patient's score is based upon: CHF History: 0 HTN History: 1 Diabetes History: 0 Stroke History: 0 Vascular Disease History: 0 Age Score: 2 Gender Score: 1   For questions or updates, please contact [facility] Please consult [email or web address] for contact info under   Signed, Manuelita Rummer, NP  12/19/2023 2:48 PM  ATTENDING ATTESTATION:  After conducting a review of all available clinical information with the care team, interviewing the patient, and performing a physical exam, I agree with the findings and plan described in this note.   GEN: No acute distress, AO x 3 HEENT:  MMM, no JVD, no scleral icterus Cardiac: RRR, no murmurs, rubs, or gallops.  Respiratory: Clear to auscultation bilaterally. GI: Soft, nontender, non-distended  MS: No edema; No deformity. Neuro:  Nonfocal  Vasc:  +2 radial pulses  Patient is a 89 year old female with a history of paroxysmal atrial fibrillation on Eliquis  who developed a community-acquired pneumonia recently.  She was treated with antibiotics but failed this regimen due to persistent fevers.  She was then started on steroids.  She was seen by her PCP and noted to be in atrial fibrillation with a rapid ventricular rate.  In the emergency department her chest x-ray demonstrates a right lower lobe infiltrate.  Her EKG demonstrates rapid atrial fibrillation with a right bundle branch block.  Patient was started on diltiazem drip.  Rapid atrial fibrillation the situation is likely compensatory in the presence of pneumonia.  Okay to continue IV diltiazem and Eliquis  5 mg twice daily.  Would transition to p.o. diltiazem over the next few days.   Will  obtain echocardiogram.  Otherwise continue current medical therapy.   Carys Malina, Pager [phone]

## 2023-12-19 NOTE — ED Notes (Signed)
 CCMD contacted.

## 2023-12-19 NOTE — H&P (Addendum)
 History and Physical    Patient: [name] FMW:[id number] DOB: Jul 23, 1924 DOA: 12/19/2023 DOS: the patient was seen and examined on 12/19/2023 PCP: Shayne Anes, MD  Patient coming from: Home  Chief Complaint:  Chief Complaint  Patient presents with   Afib RVR   HPI: [name] is a 89 y.o. female with medical history significant of Afib, HTN, osteoporosis, and no admissions at [facility] per Epic review who p/w AFRVR iso RLL CAP.  The patient had a high fever last Tuesday, prompting a call to her PCP, who prescribed antibiotics. Subsequently, she experienced pain when taking a deep breath in the collarbone area for which her PCP prescribed prednisone, which the patient completed today. Despite her recent courses of abx and steroids, her energy level remained low, and she had no appetite. After another consultation with her doctor today, she was found to be in atrial fibrillation and was sent to the hospital.   In the ED, pt tachycardic and tachypneic w/o hypoxia. Labs showed Mg 1.6, WBC 14.1, and lactic acid 1.9. EDP started IV abx (CTX/azithromycin) and requested admission for AFRVR iso CAP.   Review of Systems: As mentioned in the history of present illness. All other systems reviewed and are negative. Past Medical History:  Diagnosis Date   Hypertension    Osteoporosis    No past surgical history on file. Social History:  reports that she has never smoked. She has never used smokeless tobacco. She reports that she does not currently use alcohol . She reports that she does not use drugs.  Allergies  Allergen Reactions   Estrogens Other (See Comments)   Ezetimibe-Simvastatin     Other Reaction(s): pain in liver region   Mite (D. Farinae) Other (See Comments)   Simvastatin     Other Reaction(s): cramps in legs.    No family history on file.  Prior to Admission medications   Medication Sig Start Date End Date Taking? Authorizing Provider  acetaminophen (TYLENOL 8 HOUR ARTHRITIS  PAIN) 650 MG CR tablet Take 650 mg by mouth every 8 (eight) hours as needed for pain.    [provider]  apixaban  (ELIQUIS ) 5 MG TABS tablet TAKE 1 TABLET BY MOUTH TWICE A DAY 12/04/23   Fenton, Clint R, PA  atorvastatin (LIPITOR) 10 MG tablet Take 10 mg by mouth daily. 11/04/18   [provider]  Cholecalciferol 25 MCG (1000 UT) capsule Take 1,000 Units by mouth daily. 02/25/18   [provider]  denosumab  (PROLIA ) 60 MG/ML SOSY injection Inject 60 mg into the skin every 6 (six) months. 05/06/20   [provider]  Loratadine (CLARITIN) 10 MG CAPS Take 10 mg by mouth daily as needed (allergies). 09/21/09   [provider]  losartan (COZAAR) 50 MG tablet Take 50 mg by mouth 2 (two) times daily. 12/02/18   [provider]    Physical Exam: Vitals:   12/19/23 1200 12/19/23 1215 12/19/23 1230 12/19/23 1307  BP: (!) 129/97 122/88 (!) 138/101 (!) 126/96  Pulse: (!) 133 (!) 139 (!) 140 (!) 150  Resp: (!) 24 (!) 26 18 (!) 23  Temp:      TempSrc:      SpO2: 100% 100% 99% 98%  Weight:      Height:       General: Alert, oriented x3, resting comfortably in no acute distress Respiratory: RLL rhonchi; no wheezing Cardiovascular: Regular rate and rhythm w/o m/r/g   Data Reviewed:  Lab Results  Component Value Date  WBC 14.1 (H) 12/19/2023   HGB 12.9 12/19/2023   HCT 38.0 12/19/2023   MCV 86.4 12/19/2023   PLT 808 (H) 12/19/2023   Lab Results  Component Value Date   GLUCOSE 168 (H) 12/19/2023   CALCIUM 9.7 12/19/2023   NA 140 12/19/2023   K 4.4 12/19/2023   CO2 25 12/19/2023   CL 103 12/19/2023   BUN 28 (H) 12/19/2023   CREATININE 1.30 (H) 12/19/2023   Lab Results  Component Value Date   ALT 36 12/19/2023   AST 48 (H) 12/19/2023   ALKPHOS 62 12/19/2023   BILITOT 1.0 12/19/2023   No results found for: INR, PROTIME Radiology: DG Chest Port 1 View Result Date: 12/19/2023 CLINICAL DATA:  Shortness of breath, fever and chest  pain, atrial fibrillation EXAM: PORTABLE CHEST 1 VIEW COMPARISON:  04/05/2018 FINDINGS: Mild cardiomegaly and slight ill-defined hazy basilar opacities, favored to be atelectasis. Difficult to exclude early basilar pneumonia. Suspect trace right effusion. Aorta atherosclerotic and tortuous. Degenerative changes of the spine. Bones are osteopenic. Similar rightward tracheal deviation. No acute osseous finding. IMPRESSION: 1. Cardiomegaly with basilar atelectasis versus early pneumonia. 2. Trace right effusion. Electronically Signed   By: CHRISTELLA.  Shick M.D.   On: 12/19/2023 12:04    Assessment and Plan: 44F h/o Afib, HTN, osteoporosis, and no admissions at [facility] per Epic review who p/w AFRVR iso RLL CAP.  AFRVR -Cards consulted; apprec eval/recs -IV diltiazem for now -PTA apixaban  5mg  BID -F/u BNP and diurese if elevated given recent steroids -F/u blood cultures  RLL CAP -IV CTX 1g daily to complete 5 day CAP course -PO azithromycin 500mg  daily to complete 3 day CAP course -Duonebs prn -Wean O2 as tolerated -Ambulatory pulse ox prior to d/c  Hypomagnesemia -IV Mg 2mg  x1 -PO magnesium 400mg  daily   HTN -HOLD pta losartan for now   Advance Care Planning:   Code Status: Full Code   Consults: Cards  Family Communication: Daughter  Severity of Illness: The appropriate patient status for this patient is INPATIENT. Inpatient status is judged to be reasonable and necessary in order to provide the required intensity of service to ensure the patient's safety. The patient's presenting symptoms, physical exam findings, and initial radiographic and laboratory data in the context of their chronic comorbidities is felt to place them at high risk for further clinical deterioration. Furthermore, it is not anticipated that the patient will be medically stable for discharge from the hospital within 2 midnights of admission.   * I certify that at the point of admission it is my clinical judgment that the  patient will require inpatient hospital care spanning beyond 2 midnights from the point of admission due to high intensity of service, high risk for further deterioration and high frequency of surveillance required.*   ------- I spent 55 minutes reviewing previous notes, at the bedside counseling/discussing the treatment plan, and performing clinical documentation.  Author: Marsha Ada, MD 12/19/2023 1:12 PM  For on call review [email or web address].

## 2023-12-19 NOTE — ED Provider Notes (Signed)
 [place] EMERGENCY DEPARTMENT AT [facility] Provider Note   CSN: [id number] Arrival date & time: 12/19/23  1057     Patient presents with: No chief complaint on file.   Oluwademilade Geralds is a 89 y.o. female.   89 year old female with prior medical history as detailed below presents for evaluation.  Patient was sent from PCPs office for evaluation of A-fib with RVR.  EMS administered 20 mg of Cardizem IV and 400 mL of fluid during transport.  Patient's heart rate was in the 120s to 180s with EMS.  Patient denies prior history of A-fib or cardiac disease.  She is without acute complaint at time of initial evaluation.  Chart review suggest history of paroxysmal A-fib.  Patient is previously prescribed Eliquis .  Daughter of patient provides additional history.  Patient has been on Levaquin for the last 5 days.  This was to treat a suspected pneumonia.  Patient also received several days of prednisone.  Patient was seen by Dr. Shayne this morning and referred to the ED given tachycardia.  The history is provided by the patient and medical records.       Prior to Admission medications   Medication Sig Start Date End Date Taking? Authorizing Provider  acetaminophen (TYLENOL 8 HOUR ARTHRITIS PAIN) 650 MG CR tablet Take 650 mg by mouth every 8 (eight) hours as needed for pain.    [provider]  apixaban  (ELIQUIS ) 5 MG TABS tablet TAKE 1 TABLET BY MOUTH TWICE A DAY 12/04/23   Fenton, Clint R, PA  atorvastatin (LIPITOR) 10 MG tablet Take 10 mg by mouth daily. 11/04/18   [provider]  Cholecalciferol 25 MCG (1000 UT) capsule Take 1,000 Units by mouth daily. 02/25/18   [provider]  denosumab  (PROLIA ) 60 MG/ML SOSY injection Inject 60 mg into the skin every 6 (six) months. 05/06/20   [provider]  Loratadine (CLARITIN) 10 MG CAPS Take 10 mg by mouth daily as needed (allergies). 09/21/09   [provider]  losartan (COZAAR) 50 MG  tablet Take 50 mg by mouth 2 (two) times daily. 12/02/18   [provider]    Allergies: Estrogens, Ezetimibe-simvastatin, Mite (d. farinae), and Simvastatin    Review of Systems  All other systems reviewed and are negative.   Updated Vital Signs There were no vitals taken for this visit.  Physical Exam Vitals and nursing note reviewed.  Constitutional:      General: She is not in acute distress.    Appearance: She is well-developed.  HENT:     Head: Normocephalic and atraumatic.  Eyes:     Conjunctiva/sclera: Conjunctivae normal.  Cardiovascular:     Rate and Rhythm: Tachycardia present. Rhythm irregular.     Heart sounds: No murmur heard. Pulmonary:     Effort: Pulmonary effort is normal. No respiratory distress.     Breath sounds: Normal breath sounds.  Abdominal:     Palpations: Abdomen is soft.     Tenderness: There is no abdominal tenderness.  Musculoskeletal:        General: No swelling.     Cervical back: Neck supple.  Skin:    General: Skin is warm and dry.     Capillary Refill: Capillary refill takes less than 2 seconds.  Neurological:     Mental Status: She is alert.  Psychiatric:        Mood and Affect: Mood normal.     (all labs ordered are listed, but only abnormal results  are displayed) Labs Reviewed  CBC WITH DIFFERENTIAL/PLATELET  COMPREHENSIVE METABOLIC PANEL WITH GFR  URINALYSIS, W/ REFLEX TO CULTURE (INFECTION SUSPECTED)  I-STAT CHEM 8, ED  I-STAT CG4 LACTIC ACID, ED  TROPONIN I (HIGH SENSITIVITY)    EKG: None  Radiology: No results found.   Procedures   Medications Ordered in the ED - No data to display                                  Medical Decision Making Patient arrives from PCPs office for A-fib with RVR.  Patient with recent illness  - treated in the outpatient setting over the last week with Levaquin and p.o. steroids for suspected upper respiratory infection/pneumonia.  Patient is currently asymptomatic.     Cardizem  push given by EMS and Cardizem  drip initiated for rate control here in the ED.  Patient is on Eliquis  and she is reportedly compliant with same.  Chest x-ray suggest persistent infiltrate in the right lung.  Cultures obtained.  Rocephin  and azithromycin  administered for likely CAP.  Patient would benefit from admission for continued workup and treatment.    Amount and/or Complexity of Data Reviewed Labs: ordered. Radiology: ordered.  Risk Prescription drug management.        Final diagnoses:  Atrial fibrillation with RVR (HCC)  Community acquired pneumonia, unspecified laterality    ED Discharge Orders     None          Laurice Maude BROCKS, MD 12/19/23 1258

## 2023-12-19 NOTE — ED Triage Notes (Signed)
 Pt bib [facility] coming from doctor office for f/u of fever and cp x1week. Pt in afib rvr at 120-180. Patient has no complaints at this time. 20 cardizem, 400mL fluid. GCS 15.   EMS VS: 120/72 80 P 98% RA 204 cbg

## 2023-12-20 ENCOUNTER — Encounter (HOSPITAL_COMMUNITY): Payer: Self-pay | Admitting: Hospitalist

## 2023-12-20 ENCOUNTER — Inpatient Hospital Stay (HOSPITAL_COMMUNITY)

## 2023-12-20 DIAGNOSIS — I4891 Unspecified atrial fibrillation: Secondary | ICD-10-CM | POA: Diagnosis not present

## 2023-12-20 LAB — BASIC METABOLIC PANEL WITH GFR
Anion gap: 11 (ref 5–15)
BUN: 27 mg/dL — ABNORMAL HIGH (ref 8–23)
CO2: 23 mmol/L (ref 22–32)
Calcium: 9.2 mg/dL (ref 8.9–10.3)
Chloride: 107 mmol/L (ref 98–111)
Creatinine, Ser: 1.24 mg/dL — ABNORMAL HIGH (ref 0.44–1.00)
GFR, Estimated: 40 mL/min — ABNORMAL LOW (ref >60)
Glucose, Bld: 131 mg/dL — ABNORMAL HIGH (ref 70–99)
Potassium: 4.4 mmol/L (ref 3.5–5.1)
Sodium: 141 mmol/L (ref 135–145)

## 2023-12-20 LAB — ECHOCARDIOGRAM COMPLETE
Height: 63 in
S' Lateral: 3.3 cm
Single Plane A4C EF: 55.1 %
Weight: 2880 [oz_av]

## 2023-12-20 LAB — MAGNESIUM: Magnesium: 2.2 mg/dL (ref 1.7–2.4)

## 2023-12-20 NOTE — Progress Notes (Signed)
 PROGRESS NOTE    [name]  FMW:[id number] DOB: 16-Dec-1924 DOA: 12/19/2023 PCP: Shayne Anes, MD    Brief Narrative:  89 year old from independent living facility who at a friend's home, history of A-fib, hypertension and osteoporosis presented to the ER with rapid A-fib.  Recently diagnosed with right sided pneumonia about 10 days ago and treated with Levaquin for 5 days.  Fever improved.  Seen at PCP office and found to have A-fib with RVR with heart rate anywhere from 120-180 and sent to the ER.  Received 20 mg of IV Cardizem on the way to the ER.  In the emergency room electrolytes adequate.  Magnesium 1.6.  Troponins negative.  Lactic acid 1.9.  WC 14.  Respiratory virus panel negative.  Chest x-ray with cardiomegaly, bibasilar atelectasis and right-sided basilar atelectasis.  Started on IV Rocephin and azithromycin, IV Cardizem infusion and admitted to the hospital.  Subjective: Patient seen and examined.  Her daughter was at the bedside.  Patient herself is hard of hearing but she tells me that she does not have any chest pain, palpitations, cough fever. Remains on 10 mg of Cardizem per hour with heart rate controlled.  Assessment & Plan:   A-fib with RVR: Paroxysmal A-fib. Currently rate controlled on Cardizem infusion.  Followed by cardiology.  Therapeutic on Eliquis .  Anticipating conservative management with rate control.  Echocardiogram pending.  Likely overall Cardizem therapy versus beta-blocker therapy once EF is normal.  Right lower lobe pneumonia: Likely atelectasis.  She was treated with 5 days of Levaquin.  Currently with minimal symptoms. Antibiotics to treat bacterial pneumonia, continue Rocephin for 5 days and azithromycin for 3 days.  Chest physiotherapy, incentive spirometry, deep breathing exercises, sputum induction, mucolytic's and bronchodilators. Sputum cultures and blood cultures. Supplemental oxygen to keep saturations more than 90%. Mobilize with PT  OT.  Hypomagnesemia: Replaced.  Adequate.  Hypertension: Losartan on hold.    DVT prophylaxis:  apixaban  (ELIQUIS ) tablet 5 mg   Code Status: Full code Family Communication: Daughter at the bedside Disposition Plan: Status is: Inpatient Remains inpatient appropriate because: IV infusion to control heart rate, IV antibiotics     Consultants:  Cardiology  Procedures:  None  Antimicrobials:  Rocephin azithromycin 12/3---     Objective: Vitals:   12/19/23 2041 12/19/23 2340 12/20/23 0311 12/20/23 0841  BP: 113/63 114/77 123/84 114/82  Pulse: (!) 107 95 97 (!) 107  Resp: 20 19 18 20   Temp: 98.1 F (36.7 C) 98.4 F (36.9 C) 97.8 F (36.6 C) 98.3 F (36.8 C)  TempSrc: Oral Oral Oral Oral  SpO2: 93% 94% 95% 94%  Weight:      Height:        Intake/Output Summary (Last 24 hours) at 12/20/2023 1128 Last data filed at 12/20/2023 0508 Gross per 24 hour  Intake 605.35 ml  Output --  Net 605.35 ml   Filed Weights   12/19/23 1117  Weight: 81.6 kg    Examination:  General exam: Appears calm and comfortable.  Pleasant and interactive.  On room air. Respiratory system: Clear to auscultation. Respiratory effort normal. Cardiovascular system: S1 & S2 heard, irregularly irregular.  No pedal edema. Gastrointestinal system: Soft.  Nontender.  Bowel sound present.   Central nervous system: Alert and oriented. No focal neurological deficits. Extremities: Symmetric 5 x 5 power. Skin: No rashes, lesions or ulcers    Data Reviewed: I have personally reviewed following labs and imaging studies  CBC: Recent Labs  Lab 12/19/23 1142 12/19/23 1155  WBC 14.1*  --   NEUTROABS 12.4*  --   HGB 11.6* 12.9  HCT 36.9 38.0  MCV 86.4  --   PLT 808*  --    Basic Metabolic Panel: Recent Labs  Lab 12/19/23 1142 12/19/23 1155 12/20/23 0817  NA 139 140 141  K 4.5 4.4 4.4  CL 103 103 107  CO2 25  --  23  GLUCOSE 173* 168* 131*  BUN 25* 28* 27*  CREATININE 1.31* 1.30*  1.24*  CALCIUM 9.7  --  9.2  MG 1.6*  --  2.2   GFR: Estimated Creatinine Clearance: 26.2 mL/min (A) (by C-G formula based on SCr of 1.24 mg/dL (H)). Liver Function Tests: Recent Labs  Lab 12/19/23 1142  AST 48*  ALT 36  ALKPHOS 62  BILITOT 1.0  PROT 5.8*  ALBUMIN 2.8*   No results for input(s): LIPASE, AMYLASE in the last 168 hours. No results for input(s): AMMONIA in the last 168 hours. Coagulation Profile: No results for input(s): INR, PROTIME in the last 168 hours. Cardiac Enzymes: No results for input(s): CKTOTAL, CKMB, CKMBINDEX, TROPONINI in the last 168 hours. BNP (last 3 results) No results for input(s): PROBNP in the last 8760 hours. HbA1C: No results for input(s): HGBA1C in the last 72 hours. CBG: No results for input(s): GLUCAP in the last 168 hours. Lipid Profile: No results for input(s): CHOL, HDL, LDLCALC, TRIG, CHOLHDL, LDLDIRECT in the last 72 hours. Thyroid  Function Tests: No results for input(s): TSH, T4TOTAL, FREET4, T3FREE, THYROIDAB in the last 72 hours. Anemia Panel: No results for input(s): VITAMINB12, FOLATE, FERRITIN, TIBC, IRON, RETICCTPCT in the last 72 hours. Sepsis Labs: Recent Labs  Lab 12/19/23 1156 12/19/23 1347  LATICACIDVEN 1.9 1.3    Recent Results (from the past 240 hours)  Culture, blood (routine x 2)     Status: None (Preliminary result)   Collection Time: 12/19/23 11:14 AM   Specimen: BLOOD  Result Value Ref Range Status   Specimen Description BLOOD RIGHT ANTECUBITAL  Final   Special Requests   Final    BOTTLES DRAWN AEROBIC AND ANAEROBIC Blood Culture adequate volume   Culture   Final    NO GROWTH < 24 HOURS Performed at [facility], [address], [place], [place] [address]    Report Status PENDING  Incomplete  Resp panel by RT-PCR (RSV, Flu A&B, Covid) Anterior Nasal Swab     Status: None   Collection Time: 12/19/23 11:42 AM   Specimen: Anterior Nasal  Swab  Result Value Ref Range Status   SARS Coronavirus 2 by RT PCR NEGATIVE NEGATIVE Final   Influenza A by PCR NEGATIVE NEGATIVE Final   Influenza B by PCR NEGATIVE NEGATIVE Final    Comment: (NOTE) The Xpert Xpress SARS-CoV-2/FLU/RSV plus assay is intended as an aid in the diagnosis of influenza from Nasopharyngeal swab specimens and should not be used as a sole basis for treatment. Nasal washings and aspirates are unacceptable for Xpert Xpress SARS-CoV-2/FLU/RSV testing.  Fact Sheet for Patients: bloggercourse.com  Fact Sheet for Healthcare Providers: seriousbroker.it  This test is not yet approved or cleared by the [place]  FDA and has been authorized for detection and/or diagnosis of SARS-CoV-2 by FDA under an Emergency Use Authorization (EUA). This EUA will remain in effect (meaning this test can be used) for the duration of the COVID-19 declaration under Section 564(b)(1) of the Act, 21 U.S.C. section 360bbb-3(b)(1), unless the authorization is terminated or revoked.     Resp  Syncytial Virus by PCR NEGATIVE NEGATIVE Final    Comment: (NOTE) Fact Sheet for Patients: bloggercourse.com  Fact Sheet for Healthcare Providers: seriousbroker.it  This test is not yet approved or cleared by the [place]  FDA and has been authorized for detection and/or diagnosis of SARS-CoV-2 by FDA under an Emergency Use Authorization (EUA). This EUA will remain in effect (meaning this test can be used) for the duration of the COVID-19 declaration under Section 564(b)(1) of the Act, 21 U.S.C. section 360bbb-3(b)(1), unless the authorization is terminated or revoked.  Performed at [facility], [address] [place], [place] [address]   Culture, blood (routine x 2)     Status: None (Preliminary result)   Collection Time: 12/19/23 11:57 AM   Specimen: BLOOD LEFT HAND  Result  Value Ref Range Status   Specimen Description BLOOD LEFT HAND  Final   Special Requests   Final    BOTTLES DRAWN AEROBIC AND ANAEROBIC Blood Culture results may not be optimal due to an inadequate volume of blood received in culture bottles   Culture   Final    NO GROWTH < 24 HOURS Performed at [facility], [address] [place], [place] [address]    Report Status PENDING  Incomplete         Radiology Studies: DG Chest Port 1 View Result Date: 12/19/2023 CLINICAL DATA:  Shortness of breath, fever and chest pain, atrial fibrillation EXAM: PORTABLE CHEST 1 VIEW COMPARISON:  04/05/2018 FINDINGS: Mild cardiomegaly and slight ill-defined hazy basilar opacities, favored to be atelectasis. Difficult to exclude early basilar pneumonia. Suspect trace right effusion. Aorta atherosclerotic and tortuous. Degenerative changes of the spine. Bones are osteopenic. Similar rightward tracheal deviation. No acute osseous finding. IMPRESSION: 1. Cardiomegaly with basilar atelectasis versus early pneumonia. 2. Trace right effusion. Electronically Signed   By: CHRISTELLA.  Shick M.D.   On: 12/19/2023 12:04        Scheduled Meds:  apixaban   5 mg Oral BID   atorvastatin   10 mg Oral Daily   azithromycin   500 mg Oral Daily   magnesium  oxide  400 mg Oral Daily   Continuous Infusions:  cefTRIAXone  (ROCEPHIN )  IV 1 g (12/20/23 0831)   diltiazem  (CARDIZEM ) infusion 10 mg/hr (12/20/23 0806)     LOS: 1 day      Yama Nielson, MD [facility]

## 2023-12-20 NOTE — Progress Notes (Signed)
 Patient had an overdue medicatiion on the [facility]. It was for Losartan 50 mg. I contacted Dr. Franky to see if it needed to be given. Dr. Franky said it was being held from the cardiology note. As it only said overdue, I just wanted to make sure patient did not need it tonight.

## 2023-12-20 NOTE — Progress Notes (Addendum)
  Progress Note  Patient Name: [name] Date of Encounter: 12/20/2023 [facility] Cardiologist: None   Interval Summary    No complaints this morning, breathing ok. Using her IS regularly   Vital Signs Vitals:   12/19/23 2041 12/19/23 2340 12/20/23 0311 12/20/23 0841  BP: 113/63 114/77 123/84 114/82  Pulse: (!) 107 95 97 (!) 107  Resp: 20 19 18 20   Temp: 98.1 F (36.7 C) 98.4 F (36.9 C) 97.8 F (36.6 C) 98.3 F (36.8 C)  TempSrc: Oral Oral Oral Oral  SpO2: 93% 94% 95% 94%  Weight:      Height:        Intake/Output Summary (Last 24 hours) at 12/20/2023 1003 Last data filed at 12/20/2023 9491 Gross per 24 hour  Intake 605.35 ml  Output --  Net 605.35 ml      12/19/2023   11:17 AM 07/03/2023   10:07 AM 04/16/2023    1:48 PM  Last 3 Weights  Weight (lbs) 180 lb 181 lb 9.6 oz 179 lb  Weight (kg) 81.647 kg 82.373 kg 81.194 kg      Telemetry/ECG   Atrial fibrillation rates 90-120s - Personally Reviewed  Physical Exam  GEN: No acute distress.   Neck: No JVD Cardiac: Irreg Irreg tachycardic, no murmurs, rubs, or gallops.  Respiratory: Clear to auscultation bilaterally. GI: Soft, nontender, non-distended  MS: No edema  Assessment & Plan   89 y.o. female with a hx of paroxysmal atrial fibrillation, hypertension, osteoporosis who was seen 12/19/2023 for the evaluation of atrial fibrillation with RVR at the request of Dr. Georgina   Atrial fibrillation with RVR History of paroxysmal A-fib -- Noted in the setting of right lower lobe community-acquired pneumonia -- Seen in PCP office 12/3 and noted to be in rapid A-fib with rates between 120-180. IV Cardizem  20 mg x 1 via EMS and route.  Rates initially improved, but developed recurrent RVR.  Placed on IV diltiazem , BP remains stable. Given PNA, no plans for DCCV at this time, continue IV Diltiazem  and transition to PO as appropriate. Rates 90-120s -- echo pending -- continue Eliquis  5mg  BID    Community-acquired pneumonia -- Has been treated with Levaquin for total of 5 days by PCP, now with worsening symptoms. -- antibiotics per primary   Hypomagnesemia -- Mg improved to 2.2   HTN -- home losartan  held for now with IV diltiazem    For questions or updates, please contact [facility] Please consult [email or web address] for contact info under         Signed, Manuelita Rummer, NP   ATTENDING ATTESTATION:  After conducting a review of all available clinical information with the care team, interviewing the patient, and performing a physical exam, I agree with the findings and plan described in this note.   GEN: No acute distress, AO x 3 HEENT:  MMM, no JVD, no scleral icterus Cardiac: Irregular RR, no murmurs, rubs, or gallops.  Respiratory: Decreased BS at R base GI: Soft, nontender, non-distended  MS: No edema; No deformity. Neuro:  Nonfocal  Vasc:  +2 radial pulses  Patient doing well this morning, remains asymptomatic.  Rates well controlled on IV diltiazem .  Will follow up TTE for LV dysfunction and need to change from diltiazem .  OTW continue current therapy.  Lurena Red, MD Pager [phone]

## 2023-12-20 NOTE — Progress Notes (Signed)
   12/20/23 2333  Assess: MEWS Score  Temp 98.6 F (37 C)  BP (!) 145/94  MAP (mmHg) 110  Pulse Rate (!) 57  ECG Heart Rate (!) 110  Resp (!) 22  SpO2 95 %  O2 Device Room Air  Assess: MEWS Score  MEWS Temp 0  MEWS Systolic 0  MEWS Pulse 1  MEWS RR 1  MEWS LOC 0  MEWS Score 2  MEWS Score Color Yellow  Assess: if the MEWS score is Yellow or Red  Were vital signs accurate and taken at a resting state? Yes  Does the patient meet 2 or more of the SIRS criteria? Yes  Does the patient have a confirmed or suspected source of infection? Yes  MEWS guidelines implemented  Yes, yellow  Treat  MEWS Interventions Considered administering scheduled or prn medications/treatments as ordered  Take Vital Signs  Increase Vital Sign Frequency  Yellow: Q2hr x1, continue Q4hrs until patient remains green for 12hrs  Escalate  MEWS: Escalate Yellow: Discuss with charge nurse and consider notifying provider and/or RRT  Notify: Charge Nurse/RN  Name of Charge Nurse/RN Best Boy, RN  Assess: SIRS CRITERIA  SIRS Temperature  0  SIRS Respirations  1  SIRS Pulse 1  SIRS WBC 0  SIRS Score Sum  2

## 2023-12-20 NOTE — Evaluation (Signed)
 Physical Therapy Evaluation Patient Details Name: [name] MRN: [id number] DOB: Nov 30, 1924 Today's Date: 12/20/2023  History of Present Illness  89 y.o. female admitted 12/19/23 for Afib with RVR. Chest x-ray with cardiomegaly, bibasilar atelectasis and right-sided basilar atelectasis. Of note, diagnosed with right-sided PNA ~10 days ago and treated with Levaquin for 5 days. PMHx: A-fib, HTN, and osteoporosis.   Clinical Impression  Pt admitted with above diagnosis. PTA, pt was independent with functional mobility and ADLs. She lives in an apartment on the 3rd floor at [facility]. Pt currently with functional limitations due to the deficits listed below (see PT Problem List). She required CGA for bed mobility, transfers, and gait using RW. Pt is currently limited by generalized weakness, impaired balance, and decreased activity tolerance. Pt will benefit from acute skilled PT to increase her independence and safety with mobility to allow discharge. Recommend HHPT to increase strength, improve balance, decrease fall risk, advance activity tolerance, and optimize safety within the home environment.      If plan is discharge home, recommend the following: A little help with walking and/or transfers;A little help with bathing/dressing/bathroom;Assistance with cooking/housework;Assist for transportation;Help with stairs or ramp for entrance   Can travel by private vehicle        Equipment Recommendations None recommended by PT (Pt reports she has access to DME needed)  Recommendations for Other Services       Functional Status Assessment Patient has had a recent decline in their functional status and demonstrates the ability to make significant improvements in function in a reasonable and predictable amount of time.     Precautions / Restrictions Precautions Precautions: Fall Recall of Precautions/Restrictions: Intact Restrictions Weight Bearing Restrictions Per Provider Order: No       Mobility  Bed Mobility Overal bed mobility: Needs Assistance Bed Mobility: Supine to Sit, Sit to Supine     Supine to sit: Contact guard, HOB elevated Sit to supine: Contact guard assist, HOB elevated   General bed mobility comments: Pt sat up on R side of bed with increased time. Slight assist to help BLE off EOB, elevate trunk, and scoot hips fwd. Returning to bed pt required light assist to bring BLE back in.    Transfers Overall transfer level: Needs assistance Equipment used: None, Rolling walker (2 wheels) Transfers: Sit to/from Stand Sit to Stand: Contact guard assist           General transfer comment: Pt stood from lowest bed height. She pushed up with BUE support. Once upright pt looked unsteady. Had pt take seated rest break. Introduced RW and educated pt on proper and safe use of AD. Cued proper hand placement. She powered up with CGA. Good eccentric control, reaching back for bedrail.    Ambulation/Gait Ambulation/Gait assistance: Contact guard assist Gait Distance (Feet): 80 Feet Assistive device: Rolling walker (2 wheels) Gait Pattern/deviations: Step-through pattern, Decreased stride length, Trunk flexed Gait velocity: reduced Gait velocity interpretation: <1.8 ft/sec, indicate of risk for recurrent falls   General Gait Details: Pt ambulated with a slow step through gait pattern and fwd lean. Cues for upright posture and close proximity to RW. Pt was able to adapt well. She took symmetrical steps. Pt navigated room/hallway well with intermittent cues, no LOB. Discussed using RW for increased support currently and pt agreeable.  Stairs            Wheelchair Mobility     Tilt Bed    Modified Rankin (Stroke Patients Only)  Balance Overall balance assessment: Needs assistance Sitting-balance support: Bilateral upper extremity supported, Feet supported Sitting balance-Leahy Scale: Fair     Standing balance support: Bilateral upper  extremity supported, During functional activity, Reliant on assistive device for balance Standing balance-Leahy Scale: Poor                               Pertinent Vitals/Pain Pain Assessment Pain Assessment: No/denies pain    Home Living Family/patient expects to be discharged to:: Private residence ([facility]) Living Arrangements: Alone Available Help at Discharge: Family;Available PRN/intermittently Type of Home: Apartment (3rd floor) Home Access: Elevator;Level entry       Home Layout: One level Home Equipment: Shower seat;Hand held shower head;Grab bars - tub/shower;Grab bars - toilet;Rolling Walker (2 wheels);Wheelchair - manual      Prior Function Prior Level of Function : Independent/Modified Independent             Mobility Comments: Ambulates without AD. Denies fall hx. ADLs Comments: Indep with ADLs/IADLs. Doesn't drive d/t macular degeneration. Gets to appointment with transportation service provided from [facility] or daughter drives her.     Extremity/Trunk Assessment   Upper Extremity Assessment Upper Extremity Assessment: Generalized weakness;Right hand dominant    Lower Extremity Assessment Lower Extremity Assessment: Generalized weakness    Cervical / Trunk Assessment Cervical / Trunk Assessment: Normal  Communication   Communication Communication: Impaired Factors Affecting Communication: Hearing impaired    Cognition Arousal: Alert Behavior During Therapy: WFL for tasks assessed/performed   PT - Cognitive impairments: No apparent impairments                       PT - Cognition Comments: Pt A,Ox4 Following commands: Intact       Cueing Cueing Techniques: Verbal cues, Gestural cues     General Comments General comments (skin integrity, edema, etc.): HR: at rest 110's-120's; during activity 120's, one momentum of 140bpm during repositioning pt supine in bed, but quickly returned into the 120's.     Exercises     Assessment/Plan    PT Assessment Patient needs continued PT services  PT Problem List Decreased strength;Decreased activity tolerance;Decreased balance;Decreased mobility;Decreased knowledge of use of DME;Decreased safety awareness       PT Treatment Interventions DME instruction;Gait training;Functional mobility training;Therapeutic activities;Therapeutic exercise;Balance training;Patient/family education    PT Goals (Current goals can be found in the Care Plan section)  Acute Rehab PT Goals Patient Stated Goal: Return Home PT Goal Formulation: With patient Time For Goal Achievement: 01/03/24 Potential to Achieve Goals: Good    Frequency Min 2X/week     Co-evaluation               AM-PAC PT 6 Clicks Mobility  Outcome Measure Help needed turning from your back to your side while in a flat bed without using bedrails?: A Little Help needed moving from lying on your back to sitting on the side of a flat bed without using bedrails?: A Little Help needed moving to and from a bed to a chair (including a wheelchair)?: A Little Help needed standing up from a chair using your arms (e.g., wheelchair or bedside chair)?: A Little Help needed to walk in hospital room?: A Little Help needed climbing 3-5 steps with a railing? : A Lot 6 Click Score: 17    End of Session Equipment Utilized During Treatment: Gait belt Activity Tolerance: Patient tolerated treatment well  Patient left: in bed;with call bell/phone within reach;with bed alarm set Nurse Communication: Mobility status;Other (comment) (HR response during session) PT Visit Diagnosis: Muscle weakness (generalized) (M62.81);Difficulty in walking, not elsewhere classified (R26.2);Unsteadiness on feet (R26.81)    Time: 8386-8366 PT Time Calculation (min) (ACUTE ONLY): 20 min   Charges:   PT Evaluation $PT Eval Moderate Complexity: 1 Mod   PT General Charges $$ ACUTE PT VISIT: 1 Visit         Randall SAUNDERS,  PT, DPT Acute Rehabilitation Services Office: [phone] Secure Chat Preferred  Delon CHRISTELLA Callander 12/20/2023, 5:00 PM

## 2023-12-20 NOTE — Progress Notes (Signed)
 CCMD called and said patient had a 12 beat run of Vtach at [phone]. When I counted it looked like an 8 beat run of Vtach. Pt is asymptomatic with no chest pain. Informed Dr. Franky. Strips are saved. Will pass on to day shift and will continue to monitor.

## 2023-12-20 NOTE — Progress Notes (Signed)
 Echocardiogram 2D Echocardiogram has been performed.  [name] 12/20/2023, 11:44 AM

## 2023-12-21 DIAGNOSIS — I4891 Unspecified atrial fibrillation: Secondary | ICD-10-CM | POA: Diagnosis not present

## 2023-12-21 LAB — COMPREHENSIVE METABOLIC PANEL WITH GFR
ALT: 24 U/L (ref 0–44)
AST: 27 U/L (ref 15–41)
Albumin: 2.6 g/dL — ABNORMAL LOW (ref 3.5–5.0)
Alkaline Phosphatase: 61 U/L (ref 38–126)
Anion gap: 11 (ref 5–15)
BUN: 25 mg/dL — ABNORMAL HIGH (ref 8–23)
CO2: 25 mmol/L (ref 22–32)
Calcium: 8.9 mg/dL (ref 8.9–10.3)
Chloride: 104 mmol/L (ref 98–111)
Creatinine, Ser: 1.18 mg/dL — ABNORMAL HIGH (ref 0.44–1.00)
GFR, Estimated: 42 mL/min — ABNORMAL LOW (ref >60)
Glucose, Bld: 139 mg/dL — ABNORMAL HIGH (ref 70–99)
Potassium: 4.1 mmol/L (ref 3.5–5.1)
Sodium: 140 mmol/L (ref 135–145)
Total Bilirubin: 1 mg/dL (ref 0.0–1.2)
Total Protein: 5.7 g/dL — ABNORMAL LOW (ref 6.5–8.1)

## 2023-12-21 LAB — CBC WITH DIFFERENTIAL/PLATELET
Abs Immature Granulocytes: 0.6 K/uL — ABNORMAL HIGH (ref 0.00–0.07)
Basophils Absolute: 0.2 K/uL — ABNORMAL HIGH (ref 0.0–0.1)
Basophils Relative: 1 %
Eosinophils Absolute: 0.3 K/uL (ref 0.0–0.5)
Eosinophils Relative: 2 %
HCT: 36 % (ref 36.0–46.0)
Hemoglobin: 11.2 g/dL — ABNORMAL LOW (ref 12.0–15.0)
Immature Granulocytes: 5 %
Lymphocytes Relative: 14 %
Lymphs Abs: 1.8 K/uL (ref 0.7–4.0)
MCH: 26.7 pg (ref 26.0–34.0)
MCHC: 31.1 g/dL (ref 30.0–36.0)
MCV: 85.7 fL (ref 80.0–100.0)
Monocytes Absolute: 0.8 K/uL (ref 0.1–1.0)
Monocytes Relative: 7 %
Neutro Abs: 8.8 K/uL — ABNORMAL HIGH (ref 1.7–7.7)
Neutrophils Relative %: 71 %
Platelets: 676 K/uL — ABNORMAL HIGH (ref 150–400)
RBC: 4.2 MIL/uL (ref 3.87–5.11)
RDW: 15.3 % (ref 11.5–15.5)
WBC: 12.5 K/uL — ABNORMAL HIGH (ref 4.0–10.5)
nRBC: 0.2 % (ref 0.0–0.2)

## 2023-12-21 LAB — MAGNESIUM: Magnesium: 2 mg/dL (ref 1.7–2.4)

## 2023-12-21 MED ORDER — DILTIAZEM HCL 60 MG PO TABS
90.0000 mg | ORAL_TABLET | Freq: Three times a day (TID) | ORAL | Status: DC
  Administered 2023-12-21 – 2023-12-22 (×3): 90 mg via ORAL
  Filled 2023-12-21 (×3): qty 1

## 2023-12-21 NOTE — Plan of Care (Signed)
  Problem: Clinical Measurements: Goal: Respiratory complications will improve Outcome: Progressing Goal: Cardiovascular complication will be avoided Outcome: Progressing   Problem: Activity: Goal: Risk for activity intolerance will decrease Outcome: Progressing   Problem: Coping: Goal: Level of anxiety will decrease Outcome: Progressing   Problem: Elimination: Goal: Will not experience complications related to urinary retention Outcome: Progressing   Problem: Safety: Goal: Ability to remain free from injury will improve Outcome: Progressing

## 2023-12-21 NOTE — Care Management Important Message (Signed)
 Important Message  Patient Details  Name: [name] MRN: [id number] Date of Birth: 1924/11/29   Important Message Given:  Yes - Medicare IM     [name] 12/21/2023, 9:58 AM

## 2023-12-21 NOTE — Evaluation (Signed)
 Occupational Therapy Evaluation Patient Details Name: [name] MRN: [id number] DOB: 1924/05/01 Today's Date: 12/21/2023   History of Present Illness   89 y.o. female admitted 12/19/23 for Afib with RVR. Chest x-ray with cardiomegaly, bibasilar atelectasis and right-sided basilar atelectasis. Of note, diagnosed with right-sided PNA ~10 days ago and treated with Levaquin for 5 days. PMHx: A-fib, HTN, and osteoporosis.     Clinical Impressions PTA pt lives independently in ILF at [facility], where she ambulates to the dining room and does her own medication management. Pt demonstrates generalized weakness and would benefit from continued inpatient follow up therapy, <3 hours/day to maximize functional level fo independence with goal of returning to ILF. HR increases to high 140s/low 150s with mobility/ADL tasks - MD/nsg made aware. Acute OT to follow.       If plan is discharge home, recommend the following:   A little help with walking and/or transfers;A little help with bathing/dressing/bathroom     Functional Status Assessment   Patient has had a recent decline in their functional status and demonstrates the ability to make significant improvements in function in a reasonable and predictable amount of time.     Equipment Recommendations   None recommended by OT     Recommendations for Other Services         Precautions/Restrictions   Precautions Precautions: Fall Recall of Precautions/Restrictions: Intact Restrictions Weight Bearing Restrictions Per Provider Order: No     Mobility Bed Mobility Overal bed mobility: Needs Assistance Bed Mobility: Supine to Sit, Sit to Supine     Supine to sit: Contact guard, HOB elevated          Transfers Overall transfer level: Needs assistance Equipment used: None, Rolling walker (2 wheels) Transfers: Sit to/from Stand Sit to Stand: Contact guard assist                  Balance Overall balance  assessment: Needs assistance Sitting-balance support: Bilateral upper extremity supported, Feet supported Sitting balance-Leahy Scale: Fair     Standing balance support: Bilateral upper extremity supported, During functional activity, Reliant on assistive device for balance Standing balance-Leahy Scale: Fair                             ADL either performed or assessed with clinical judgement   ADL Overall ADL's : Needs assistance/impaired Eating/Feeding: Set up   Grooming: Contact guard assist   Upper Body Bathing: Set up   Lower Body Bathing: Minimal assistance;Sit to/from stand   Upper Body Dressing : Set up;Sitting   Lower Body Dressing: Minimal assistance;Sit to/from stand   Toilet Transfer: Contact guard assist;Ambulation   Toileting- Clothing Manipulation and Hygiene: Minimal assistance;Sit to/from stand       Functional mobility during ADLs: Contact guard assist;Rolling walker (2 wheels)       Vision Baseline Vision/History: 6 Macular Degeneration Ability to See in Adequate Light: 2 Moderately impaired  Discussed possibility of working with a low vision specialist - will provide information/resource information     Perception         Praxis         Pertinent Vitals/Pain       Extremity/Trunk Assessment Upper Extremity Assessment Upper Extremity Assessment: Generalized weakness   Lower Extremity Assessment Lower Extremity Assessment: Defer to PT evaluation   Cervical / Trunk Assessment Cervical / Trunk Assessment: Normal   Communication Communication Communication: Impaired Factors Affecting Communication: Hearing impaired  Cognition Arousal: Alert Behavior During Therapy: WFL for tasks assessed/performed Cognition: History of cognitive impairments (daughter present and states her Mom has been more forgetful lately but most likely at baseline)                               Following commands: Intact        Cueing  General Comments   Cueing Techniques: Verbal cues;Gestural cues      Exercises     Shoulder Instructions      Home Living Family/patient expects to be discharged to:: Private residence ([facility]) Living Arrangements: Alone Available Help at Discharge: Family;Available PRN/intermittently Type of Home: Apartment (3rd floor) Home Access: Elevator;Level entry     Home Layout: One level     Bathroom Shower/Tub: Producer, Television/film/video: Handicapped height Bathroom Accessibility: Yes How Accessible: Accessible via wheelchair Home Equipment: Shower seat;Hand held shower head;Grab bars - tub/shower;Grab bars - toilet;Rolling Walker (2 wheels);Wheelchair - manual          Prior Functioning/Environment Prior Level of Function : Independent/Modified Independent             Mobility Comments: Ambulates without AD. Denies fall hx. ADLs Comments: Indep with ADLs/IADLs. Doesn't drive d/t macular degeneration. Gets to appointment with transportation service provided from [facility] or daughter drives her.    OT Problem List: Decreased strength;Decreased activity tolerance;Cardiopulmonary status limiting activity   OT Treatment/Interventions: Self-care/ADL training;Therapeutic exercise;Energy conservation;DME and/or AE instruction;Therapeutic activities;Patient/family education      OT Goals(Current goals can be found in the care plan section)   Acute Rehab OT Goals Patient Stated Goal: rehab OT Goal Formulation: With patient/family Time For Goal Achievement: 01/04/24 Potential to Achieve Goals: Good   OT Frequency:  Min 2X/week    Co-evaluation              AM-PAC OT 6 Clicks Daily Activity     Outcome Measure Help from another person eating meals?: None Help from another person taking care of personal grooming?: A Little Help from another person toileting, which includes using toliet, bedpan, or urinal?: A Little Help from  another person bathing (including washing, rinsing, drying)?: A Little Help from another person to put on and taking off regular upper body clothing?: A Little Help from another person to put on and taking off regular lower body clothing?: A Little 6 Click Score: 19   End of Session Equipment Utilized During Treatment: Gait belt;Rolling walker (2 wheels) Nurse Communication: Other (comment) (HR in the 140s/150s)  Activity Tolerance: Patient tolerated treatment well Patient left: in chair;with call bell/phone within reach;with family/visitor present  OT Visit Diagnosis: Unsteadiness on feet (R26.81);Other abnormalities of gait and mobility (R26.89);Muscle weakness (generalized) (M62.81)                Time: 0950-1020 OT Time Calculation (min): 30 min Charges:  OT General Charges $OT Visit: 1 Visit OT Evaluation $OT Eval Low Complexity: 1 Low OT Treatments $Self Care/Home Management : 8-22 mins  Kreg Sink, OT/L   Acute OT Clinical Specialist Acute Rehabilitation Services Pager [phone] Office [phone]   [facility] 12/21/2023, 10:35 AM

## 2023-12-21 NOTE — Progress Notes (Signed)
 PROGRESS NOTE    [name]  FMW:[id number] DOB: Sep 08, 1924 DOA: 12/19/2023 PCP: Shayne Anes, MD    Brief Narrative:  89 year old [facility] living [facility], history of A-fib, hypertension and osteoporosis presented to the ER with rapid A-fib.  Recently diagnosed with right sided pneumonia about 10 days ago and treated with Levaquin for 5 days.  Fever improved.  Seen at PCP office and found to have A-fib with RVR with heart rate anywhere from 120-180 and sent to the ER.  Received 20 mg of IV Cardizem  on the way to the ER.  In the emergency room electrolytes adequate.  Magnesium  1.6.  Troponins negative.  Lactic acid 1.9.  WBC 14.  Respiratory virus panel negative.  Chest x-ray with cardiomegaly, bibasilar atelectasis and right-sided basilar atelectasis.  Started on IV Rocephin  and azithromycin , IV Cardizem  infusion and admitted to the hospital.  Subjective: Patient seen and examined.  She herself denies any complaints.  Breathing better.  On room air.  Remains on 10 mg of Cardizem  per hour with suboptimally controlled heart rate.  Denies any chest pain or palpitations.  Her daughter at the bedside.  Assessment & Plan:   A-fib with RVR: Paroxysmal A-fib. Currently rate fluctuating on Cardizem  infusion.  Followed by cardiology.  Therapeutic on Eliquis .  Anticipating conservative management with rate control.  Echocardiogram with normal ejection fraction.   Will defer to cardiology for further rate control medication adjustment.  Right lower lobe pneumonia: Likely atelectasis.  She was treated with 5 days of Levaquin.  Currently with minimal symptoms. Continue Rocephin  and azithromycin , however anticipate oral antibiotics on discharge.   Chest physiotherapy, incentive spirometry, deep breathing exercises, sputum induction, mucolytic's and bronchodilators. Sputum cultures and blood cultures. Supplemental oxygen to keep saturations more than 90%. Mobilize with PT  OT.  Hypomagnesemia: Replaced.  Adequate.  Hypertension: Losartan  on hold.  Blood pressure is stable.    DVT prophylaxis:  apixaban  (ELIQUIS ) tablet 5 mg   Code Status: Full code Family Communication: Daughter at the bedside Disposition Plan: Status is: Inpatient Remains inpatient appropriate because: IV infusion to control heart rate, IV antibiotics     Consultants:  Cardiology  Procedures:  None  Antimicrobials:  Rocephin  azithromycin  12/3---     Objective: Vitals:   12/20/23 2333 12/21/23 0405 12/21/23 0638 12/21/23 0811  BP: (!) 145/94 118/79  118/80  Pulse: (!) 57 99 (!) 104 (!) 58  Resp: (!) 22 (!) 22 (!) 21 (!) 25  Temp: 98.6 F (37 C) 97.8 F (36.6 C)  98.6 F (37 C)  TempSrc: Oral Oral  Oral  SpO2: 95% 94% 92% 92%  Weight:      Height:        Intake/Output Summary (Last 24 hours) at 12/21/2023 1207 Last data filed at 12/21/2023 0701 Gross per 24 hour  Intake 736.97 ml  Output --  Net 736.97 ml   Filed Weights   12/19/23 1117  Weight: 81.6 kg    Examination:  General exam: Pleasant and interactive. Respiratory system: Clear to auscultation. Respiratory effort normal. Cardiovascular system: S1 & S2 heard, irregularly irregular.  No pedal edema. Gastrointestinal system: Soft.  Nontender.  Bowel sound present.   Central nervous system: Alert and oriented. No focal neurological deficits. Extremities: Symmetric 5 x 5 power. Skin: No rashes, lesions or ulcers    Data Reviewed: I have personally reviewed following labs and imaging studies  CBC: Recent Labs  Lab 12/19/23 1142 12/19/23 1155 12/21/23 0357  WBC 14.1*  --  12.5*  NEUTROABS 12.4*  --  8.8*  HGB 11.6* 12.9 11.2*  HCT 36.9 38.0 36.0  MCV 86.4  --  85.7  PLT 808*  --  676*   Basic Metabolic Panel: Recent Labs  Lab 12/19/23 1142 12/19/23 1155 12/20/23 0817 12/21/23 0357  NA 139 140 141 140  K 4.5 4.4 4.4 4.1  CL 103 103 107 104  CO2 25  --  23 25  GLUCOSE 173* 168*  131* 139*  BUN 25* 28* 27* 25*  CREATININE 1.31* 1.30* 1.24* 1.18*  CALCIUM  9.7  --  9.2 8.9  MG 1.6*  --  2.2 2.0   GFR: Estimated Creatinine Clearance: 27.6 mL/min (A) (by C-G formula based on SCr of 1.18 mg/dL (H)). Liver Function Tests: Recent Labs  Lab 12/19/23 1142 12/21/23 0357  AST 48* 27  ALT 36 24  ALKPHOS 62 61  BILITOT 1.0 1.0  PROT 5.8* 5.7*  ALBUMIN 2.8* 2.6*   No results for input(s): LIPASE, AMYLASE in the last 168 hours. No results for input(s): AMMONIA in the last 168 hours. Coagulation Profile: No results for input(s): INR, PROTIME in the last 168 hours. Cardiac Enzymes: No results for input(s): CKTOTAL, CKMB, CKMBINDEX, TROPONINI in the last 168 hours. BNP (last 3 results) No results for input(s): PROBNP in the last 8760 hours. HbA1C: No results for input(s): HGBA1C in the last 72 hours. CBG: No results for input(s): GLUCAP in the last 168 hours. Lipid Profile: No results for input(s): CHOL, HDL, LDLCALC, TRIG, CHOLHDL, LDLDIRECT in the last 72 hours. Thyroid  Function Tests: No results for input(s): TSH, T4TOTAL, FREET4, T3FREE, THYROIDAB in the last 72 hours. Anemia Panel: No results for input(s): VITAMINB12, FOLATE, FERRITIN, TIBC, IRON, RETICCTPCT in the last 72 hours. Sepsis Labs: Recent Labs  Lab 12/19/23 1156 12/19/23 1347  LATICACIDVEN 1.9 1.3    Recent Results (from the past 240 hours)  Culture, blood (routine x 2)     Status: None (Preliminary result)   Collection Time: 12/19/23 11:14 AM   Specimen: BLOOD  Result Value Ref Range Status   Specimen Description BLOOD RIGHT ANTECUBITAL  Final   Special Requests   Final    BOTTLES DRAWN AEROBIC AND ANAEROBIC Blood Culture adequate volume   Culture   Final    NO GROWTH 2 DAYS Performed at [facility], [address], [place], [place] [address]    Report Status PENDING  Incomplete  Resp panel by RT-PCR (RSV, Flu A&B,  Covid) Anterior Nasal Swab     Status: None   Collection Time: 12/19/23 11:42 AM   Specimen: Anterior Nasal Swab  Result Value Ref Range Status   SARS Coronavirus 2 by RT PCR NEGATIVE NEGATIVE Final   Influenza A by PCR NEGATIVE NEGATIVE Final   Influenza B by PCR NEGATIVE NEGATIVE Final    Comment: (NOTE) The Xpert Xpress SARS-CoV-2/FLU/RSV plus assay is intended as an aid in the diagnosis of influenza from Nasopharyngeal swab specimens and should not be used as a sole basis for treatment. Nasal washings and aspirates are unacceptable for Xpert Xpress SARS-CoV-2/FLU/RSV testing.  Fact Sheet for Patients: bloggercourse.com  Fact Sheet for Healthcare Providers: seriousbroker.it  This test is not yet approved or cleared by the [place]  FDA and has been authorized for detection and/or diagnosis of SARS-CoV-2 by FDA under an Emergency Use Authorization (EUA). This EUA will remain in effect (meaning this test can be used) for the duration of the COVID-19 declaration under Section 564(b)(1)  of the Act, 21 U.S.C. section 360bbb-3(b)(1), unless the authorization is terminated or revoked.     Resp Syncytial Virus by PCR NEGATIVE NEGATIVE Final    Comment: (NOTE) Fact Sheet for Patients: bloggercourse.com  Fact Sheet for Healthcare Providers: seriousbroker.it  This test is not yet approved or cleared by the [place]  FDA and has been authorized for detection and/or diagnosis of SARS-CoV-2 by FDA under an Emergency Use Authorization (EUA). This EUA will remain in effect (meaning this test can be used) for the duration of the COVID-19 declaration under Section 564(b)(1) of the Act, 21 U.S.C. section 360bbb-3(b)(1), unless the authorization is terminated or revoked.  Performed at [facility], [address] [place], [place] [address]   Culture, blood (routine x 2)      Status: None (Preliminary result)   Collection Time: 12/19/23 11:57 AM   Specimen: BLOOD LEFT HAND  Result Value Ref Range Status   Specimen Description BLOOD LEFT HAND  Final   Special Requests   Final    BOTTLES DRAWN AEROBIC AND ANAEROBIC Blood Culture results may not be optimal due to an inadequate volume of blood received in culture bottles   Culture   Final    NO GROWTH 2 DAYS Performed at [facility], [address], [place], [place] [address]    Report Status PENDING  Incomplete         Radiology Studies: ECHOCARDIOGRAM COMPLETE Result Date: 12/20/2023    ECHOCARDIOGRAM REPORT   Patient Name:   BENNETTA RUDDEN Date of Exam: 12/20/2023 Medical Rec #:  [id number]     Height:       63.0 in Accession #:    [id number]    Weight:       180.0 lb Date of Birth:  1924-12-28      BSA:          1.849 m Patient Age:    89 years      BP:           107/60 mmHg Patient Gender: F             HR:           92 bpm. Exam Location:  Inpatient Procedure: 2D Echo, Cardiac Doppler and Color Doppler (Both Spectral and Color            Flow Doppler were utilized during procedure). Indications:    Atrial Fibrillation  History:        Patient has no prior history of Echocardiogram examinations.                 Arrythmias:Atrial Fibrillation; Risk Factors:Hypertension and                 Dyslipidemia.  Sonographer:    Juliene Rucks Referring Phys: [id number] LINDSAY B ROBERTS IMPRESSIONS  1. Left ventricular ejection fraction, by estimation, is 55 to 60%. The left ventricle has normal function. Left ventricular endocardial border not optimally defined to evaluate regional wall motion. Left ventricular diastolic function could not be evaluated.  2. Right ventricular systolic function is mildly reduced. The right ventricular size is normal. There is mildly elevated pulmonary artery systolic pressure.  3. Left atrial size was severely dilated.  4. The mitral valve is normal in structure. Mild mitral valve regurgitation.   5. The aortic valve is calcified. Aortic valve regurgitation is not visualized. Aortic valve sclerosis/calcification is present, without any evidence of aortic stenosis. Comparison(s): No prior Echocardiogram. FINDINGS  Left Ventricle: Left ventricular ejection fraction, by estimation, is 55 to 60%. The left ventricle has normal function. Left ventricular endocardial border not optimally defined to evaluate regional wall motion. The left ventricular internal cavity size was normal in size. There is no left ventricular hypertrophy. Left ventricular diastolic function could not be evaluated due to atrial fibrillation. Left ventricular diastolic function could not be evaluated. Right Ventricle: The right ventricular size is normal. Right vetricular wall thickness was not well visualized. Right ventricular systolic function is mildly reduced. There is mildly elevated pulmonary artery systolic pressure. The tricuspid regurgitant velocity is 2.88 m/s, and with an assumed right atrial pressure of 3 mmHg, the estimated right ventricular systolic pressure is 36.2 mmHg. Left Atrium: Left atrial size was severely dilated. Right Atrium: Right atrial size was not well visualized. Pericardium: The pericardium was not well visualized. Mitral Valve: The mitral valve is normal in structure. Mild mitral valve regurgitation. Tricuspid Valve: The tricuspid valve is normal in structure. Tricuspid valve regurgitation is mild. Aortic Valve: The aortic valve is calcified. Aortic valve regurgitation is not visualized. Aortic valve sclerosis/calcification is present, without any evidence of aortic stenosis. Pulmonic Valve: The pulmonic valve was not well visualized. Pulmonic valve regurgitation is not visualized. Aorta: The aortic root is normal in size and structure. Venous: The inferior vena cava was not well visualized. IAS/Shunts: The interatrial septum was not well visualized.  LEFT VENTRICLE PLAX 2D LVIDd:         5.00 cm       Diastology LVIDs:         3.30 cm      LV e' medial:  5.51 cm/s LV PW:         1.00 cm      LV e' lateral: 3.65 cm/s LV IVS:        0.90 cm LVOT diam:     1.80 cm LVOT Area:     2.54 cm  LV Volumes (MOD) LV vol d, MOD A4C: 116.0 ml LV vol s, MOD A4C: 52.1 ml LV SV MOD A4C:     116.0 ml RIGHT VENTRICLE            IVC RV Basal diam:  3.10 cm    IVC diam: 1.90 cm RV Mid diam:    2.60 cm RV S prime:     5.59 cm/s TAPSE (M-mode): 0.9 cm LEFT ATRIUM            Index        RIGHT ATRIUM           Index LA diam:      3.30 cm  1.78 cm/m   RA Area:     15.40 cm LA Vol (A4C): 123.0 ml 66.52 ml/m  RA Volume:   37.80 ml  20.44 ml/m   AORTA Ao Root diam: 2.70 cm TRICUSPID VALVE TR Peak grad:   33.2 mmHg TR Vmax:        288.00 cm/s  SHUNTS Systemic Diam: 1.80 cm Franck Azobou Tonleu Electronically signed by Joelle Cedars Tonleu Signature Date/Time: 12/20/2023/1:17:54 PM    Final         Scheduled Meds:  apixaban   5 mg Oral BID   atorvastatin   10 mg Oral Daily   magnesium  oxide  400 mg Oral Daily   Continuous Infusions:  cefTRIAXone  (ROCEPHIN )  IV 1 g (12/21/23 0943)   diltiazem  (CARDIZEM ) infusion 15 mg/hr (12/21/23 1101)     LOS: 2 days  Renato Applebaum, MD [facility]

## 2023-12-21 NOTE — TOC Initial Note (Signed)
 Transition of Care [facility]) - Initial/Assessment Note    Patient Details  Name: [name] MRN: [id number] Date of Birth: [birth year]  [facility] of [facility]) CM/SW Contact:    [name], LCSWA Phone Number: 12/21/2023, 10:59 AM  Clinical Narrative:                  [facility] is from [facility]. MD informed CSW that patient is wanting to return to SNF side for short term rehab. CSW awaiting PT/OT eval. CSW spoke with Lonell with [facility] who confirmed patient can dc over to facility tomorrow if medically ready if recs are for SNF. If patient Dcs over the Weekend, please call On call RN tele# is [phone]. CSW will continue to follow.       Patient Goals and CMS Choice            Expected Discharge Plan and Services                                              Prior Living Arrangements/Services                       Activities of Daily Living   ADL Screening (condition at time of admission) Independently performs ADLs?: Yes (appropriate for developmental age) Is the patient deaf or have difficulty hearing?: No Does the patient have difficulty seeing, even when wearing glasses/contacts?: No Does the patient have difficulty concentrating, remembering, or making decisions?: No  Permission Sought/Granted                  Emotional Assessment              Admission diagnosis:  Atrial fibrillation with rapid ventricular response (HCC) [I48.91] Atrial fibrillation with RVR (HCC) [I48.91] Community acquired pneumonia, unspecified laterality [J18.9] Patient Active Problem List   Diagnosis Date Noted   Atrial fibrillation with rapid ventricular response (HCC) 12/19/2023   OP (osteoporosis) 11/20/2023   Paroxysmal atrial fibrillation (HCC) 04/16/2023   Hypercoagulable state due to paroxysmal atrial fibrillation (HCC) 04/16/2023   ABDOMINAL PAIN, RIGHT UPPER QUADRANT 10/29/2008   HYPOTHYROIDISM 12/25/2006   HYPERLIPIDEMIA  12/25/2006   ALLERGIC RHINITIS 12/25/2006   URINARY INCONTINENCE 12/25/2006   POSITIVE PPD 12/25/2006   PCP:  Shayne Anes, MD Pharmacy:   [id number] - [place] - [address] AT [facility] [address] [place] [address] Phone: [phone] Fax: [phone]  [id number] - [place] [facility] - [address] [place] [address] Phone: [phone] Fax: [phone]     Social Drivers of Health (SDOH) Social History: SDOH Screenings   Food Insecurity: No Food Insecurity (12/19/2023)  Housing: Low Risk  (12/19/2023)  Transportation Needs: No Transportation Needs (12/19/2023)  Utilities: Not At Risk (12/19/2023)  Social Connections: Moderately Isolated (12/19/2023)  Tobacco Use: Low Risk  (12/20/2023)   SDOH Interventions:     Readmission Risk Interventions     No data to display

## 2023-12-21 NOTE — Progress Notes (Signed)
   12/21/23 1300  PT Visit Information  Last PT Received On 12/21/23   PT - Assessment/Plan  Follow Up Recommendations Skilled nursing-short term rehab (<3 hours/day)   Discussed pt with OT after OT session. Given that pt has long distances to walk to meals and needs to be Modif I agree with post acute rehab < 3 hours day.  Will continue to follow acutely. Plummer Matich M,PT Acute Rehab Services [phone]

## 2023-12-22 DIAGNOSIS — I4891 Unspecified atrial fibrillation: Secondary | ICD-10-CM | POA: Diagnosis not present

## 2023-12-22 MED ORDER — DILTIAZEM HCL ER COATED BEADS 180 MG PO CP24
300.0000 mg | ORAL_CAPSULE | Freq: Every day | ORAL | Status: DC
  Administered 2023-12-22 – 2023-12-23 (×2): 300 mg via ORAL
  Filled 2023-12-22 (×2): qty 1

## 2023-12-22 NOTE — Plan of Care (Signed)
  Problem: Clinical Measurements: Goal: Respiratory complications will improve Outcome: Progressing Goal: Cardiovascular complication will be avoided Outcome: Progressing   Problem: Activity: Goal: Risk for activity intolerance will decrease Outcome: Progressing   Problem: Nutrition: Goal: Adequate nutrition will be maintained Outcome: Progressing   Problem: Elimination: Goal: Will not experience complications related to urinary retention Outcome: Progressing   Problem: Safety: Goal: Ability to remain free from injury will improve Outcome: Progressing

## 2023-12-22 NOTE — Progress Notes (Signed)
 PROGRESS NOTE    [name]  FMW:[id number] DOB: 12-May-1924 DOA: 12/19/2023 PCP: Sydney Anes, MD    Brief Narrative:  89 year old from independent living at friend's home, history of A-fib, hypertension and osteoporosis presented to the ER with rapid A-fib.  Recently diagnosed with right sided pneumonia about 10 days ago and treated with Levaquin for 5 days.  Fever improved.  Seen at PCP office and found to have A-fib with RVR with heart rate anywhere from 120-180 and sent to the ER.  Received 20 mg of IV Cardizem  on the way to the ER.  In the emergency room electrolytes adequate.  Magnesium  1.6.  Troponins negative.  Lactic acid 1.9.  WBC 14.  Respiratory virus panel negative.  Chest x-ray with cardiomegaly, bibasilar atelectasis and right-sided basilar atelectasis.  Started on IV Rocephin  and azithromycin , IV Cardizem  infusion and admitted to the hospital.  Subjective:  Patient seen and examined.  Patient herself denies any complaints today.  Denies any chest pain shortness of breath or palpitations.  At rest her heart rate is mostly 80-90. With mobility her heart rate has been more than 130.  Patient is currently on Cardizem  90 mg 3 times daily. Will start Cardizem  CD 300 and monitor.  Especially tachycardic with mobility.  Assessment & Plan:   A-fib with RVR: Paroxysmal A-fib. Treated with Cardizem  infusion.  Therapeutic on Eliquis .  Echo with normal EF.   Cardizem  90 mg 3 times daily, suboptimally controlled.  Changing to Cardizem  CD 300 mg daily today.  Monitor today with mobility.  If adequate rate control will discharge on same dose.    Right lower lobe pneumonia: Likely atelectasis or remaining infection.  She was treated with 5 days of Levaquin.  Azithromycin  x 3 days Rocephin  x 5 days.  She will complete IV antibiotic therapy tomorrow.  Continue to encourage chest physiotherapy and incentive spirometry.  Sputum cultures and blood cultures negative.   Hypomagnesemia:  Replaced.  Will keep on oral replacement.  Hypertension: Losartan  on hold.  Blood pressure is stable.  To make room for increasing dose of Cardizem , will discontinue losartan .    DVT prophylaxis:  apixaban  (ELIQUIS ) tablet 5 mg   Code Status: Full code Family Communication: None today. Disposition Plan: Status is: Inpatient Remains inpatient appropriate because: IV antibiotics, heart rate controlled.  Likely SNF tomorrow.     Consultants:  Cardiology  Procedures:  None  Antimicrobials:  Rocephin  azithromycin  12/3---     Objective: Vitals:   12/22/23 0339 12/22/23 0542 12/22/23 0743 12/22/23 0800  BP: 118/79 (!) 125/96 (!) 143/86   Pulse: 62  (!) 108   Resp: (!) 23  18 18   Temp: 98.8 F (37.1 C)  98.5 F (36.9 C)   TempSrc: Oral  Oral   SpO2: 98%  98%   Weight:      Height:        Intake/Output Summary (Last 24 hours) at 12/22/2023 1107 Last data filed at 12/22/2023 0421 Gross per 24 hour  Intake 584.36 ml  Output --  Net 584.36 ml   Filed Weights   12/19/23 1117  Weight: 81.6 kg    Examination:  General exam: Comfortable.  Pleasant and interactive. Respiratory system: Clear to auscultation. Respiratory effort normal.  On room air. Cardiovascular system: S1 & S2 heard, irregularly irregular.  No pedal edema. Gastrointestinal system: Soft.  Nontender.  Bowel sound present.   Central nervous system: Alert and oriented. No focal neurological deficits. Extremities: Symmetric 5 x 5 power. Skin:  No rashes, lesions or ulcers    Data Reviewed: I have personally reviewed following labs and imaging studies  CBC: Recent Labs  Lab 12/19/23 1142 12/19/23 1155 12/21/23 0357  WBC 14.1*  --  12.5*  NEUTROABS 12.4*  --  8.8*  HGB 11.6* 12.9 11.2*  HCT 36.9 38.0 36.0  MCV 86.4  --  85.7  PLT 808*  --  676*   Basic Metabolic Panel: Recent Labs  Lab 12/19/23 1142 12/19/23 1155 12/20/23 0817 12/21/23 0357  NA 139 140 141 140  K 4.5 4.4 4.4 4.1  CL 103  103 107 104  CO2 25  --  23 25  GLUCOSE 173* 168* 131* 139*  BUN 25* 28* 27* 25*  CREATININE 1.31* 1.30* 1.24* 1.18*  CALCIUM  9.7  --  9.2 8.9  MG 1.6*  --  2.2 2.0   GFR: Estimated Creatinine Clearance: 27.6 mL/min (A) (by C-G formula based on SCr of 1.18 mg/dL (H)). Liver Function Tests: Recent Labs  Lab 12/19/23 1142 12/21/23 0357  AST 48* 27  ALT 36 24  ALKPHOS 62 61  BILITOT 1.0 1.0  PROT 5.8* 5.7*  ALBUMIN 2.8* 2.6*   No results for input(s): LIPASE, AMYLASE in the last 168 hours. No results for input(s): AMMONIA in the last 168 hours. Coagulation Profile: No results for input(s): INR, PROTIME in the last 168 hours. Cardiac Enzymes: No results for input(s): CKTOTAL, CKMB, CKMBINDEX, TROPONINI in the last 168 hours. BNP (last 3 results) No results for input(s): PROBNP in the last 8760 hours. HbA1C: No results for input(s): HGBA1C in the last 72 hours. CBG: No results for input(s): GLUCAP in the last 168 hours. Lipid Profile: No results for input(s): CHOL, HDL, LDLCALC, TRIG, CHOLHDL, LDLDIRECT in the last 72 hours. Thyroid  Function Tests: No results for input(s): TSH, T4TOTAL, FREET4, T3FREE, THYROIDAB in the last 72 hours. Anemia Panel: No results for input(s): VITAMINB12, FOLATE, FERRITIN, TIBC, IRON, RETICCTPCT in the last 72 hours. Sepsis Labs: Recent Labs  Lab 12/19/23 1156 12/19/23 1347  LATICACIDVEN 1.9 1.3    Recent Results (from the past 240 hours)  Culture, blood (routine x 2)     Status: None (Preliminary result)   Collection Time: 12/19/23 11:14 AM   Specimen: BLOOD  Result Value Ref Range Status   Specimen Description BLOOD RIGHT ANTECUBITAL  Final   Special Requests   Final    BOTTLES DRAWN AEROBIC AND ANAEROBIC Blood Culture adequate volume   Culture   Final    NO GROWTH 3 DAYS Performed at [facility], [address], [place], [place] [address]    Report Status PENDING   Incomplete  Resp panel by RT-PCR (RSV, Flu A&B, Covid) Anterior Nasal Swab     Status: None   Collection Time: 12/19/23 11:42 AM   Specimen: Anterior Nasal Swab  Result Value Ref Range Status   SARS Coronavirus 2 by RT PCR NEGATIVE NEGATIVE Final   Influenza A by PCR NEGATIVE NEGATIVE Final   Influenza B by PCR NEGATIVE NEGATIVE Final    Comment: (NOTE) The Xpert Xpress SARS-CoV-2/FLU/RSV plus assay is intended as an aid in the diagnosis of influenza from Nasopharyngeal swab specimens and should not be used as a sole basis for treatment. Nasal washings and aspirates are unacceptable for Xpert Xpress SARS-CoV-2/FLU/RSV testing.  Fact Sheet for Patients: bloggercourse.com  Fact Sheet for Healthcare Providers: seriousbroker.it  This test is not yet approved or cleared by the [place]  FDA and has been  authorized for detection and/or diagnosis of SARS-CoV-2 by FDA under an Emergency Use Authorization (EUA). This EUA will remain in effect (meaning this test can be used) for the duration of the COVID-19 declaration under Section 564(b)(1) of the Act, 21 U.S.C. section 360bbb-3(b)(1), unless the authorization is terminated or revoked.     Resp Syncytial Virus by PCR NEGATIVE NEGATIVE Final    Comment: (NOTE) Fact Sheet for Patients: bloggercourse.com  Fact Sheet for Healthcare Providers: seriousbroker.it  This test is not yet approved or cleared by the [place]  FDA and has been authorized for detection and/or diagnosis of SARS-CoV-2 by FDA under an Emergency Use Authorization (EUA). This EUA will remain in effect (meaning this test can be used) for the duration of the COVID-19 declaration under Section 564(b)(1) of the Act, 21 U.S.C. section 360bbb-3(b)(1), unless the authorization is terminated or revoked.  Performed at [facility], [address],  [place], [place] [address]   Culture, blood (routine x 2)     Status: None (Preliminary result)   Collection Time: 12/19/23 11:57 AM   Specimen: BLOOD LEFT HAND  Result Value Ref Range Status   Specimen Description BLOOD LEFT HAND  Final   Special Requests   Final    BOTTLES DRAWN AEROBIC AND ANAEROBIC Blood Culture results may not be optimal due to an inadequate volume of blood received in culture bottles   Culture   Final    NO GROWTH 3 DAYS Performed at [facility], [address] [place], [place] [address]    Report Status PENDING  Incomplete         Radiology Studies: ECHOCARDIOGRAM COMPLETE Result Date: 12/20/2023    ECHOCARDIOGRAM REPORT   Patient Name:   SHAQUETA CASADY Date of Exam: 12/20/2023 Medical Rec #:  [id number]     Height:       63.0 in Accession #:    [id number]    Weight:       180.0 lb Date of Birth:  1924-10-13      BSA:          1.849 m Patient Age:    89 years      BP:           107/60 mmHg Patient Gender: F             HR:           92 bpm. Exam Location:  Inpatient Procedure: 2D Echo, Cardiac Doppler and Color Doppler (Both Spectral and Color            Flow Doppler were utilized during procedure). Indications:    Atrial Fibrillation  History:        Patient has no prior history of Echocardiogram examinations.                 Arrythmias:Atrial Fibrillation; Risk Factors:Hypertension and                 Dyslipidemia.  Sonographer:    Juliene Rucks Referring Phys: [phone] LINDSAY B ROBERTS IMPRESSIONS  1. Left ventricular ejection fraction, by estimation, is 55 to 60%. The left ventricle has normal function. Left ventricular endocardial border not optimally defined to evaluate regional wall motion. Left ventricular diastolic function could not be evaluated.  2. Right ventricular systolic function is mildly reduced. The right ventricular size is normal. There is mildly elevated pulmonary artery systolic pressure.  3. Left atrial size was severely dilated.  4. The mitral valve  is  normal in structure. Mild mitral valve regurgitation.  5. The aortic valve is calcified. Aortic valve regurgitation is not visualized. Aortic valve sclerosis/calcification is present, without any evidence of aortic stenosis. Comparison(s): No prior Echocardiogram. FINDINGS  Left Ventricle: Left ventricular ejection fraction, by estimation, is 55 to 60%. The left ventricle has normal function. Left ventricular endocardial border not optimally defined to evaluate regional wall motion. The left ventricular internal cavity size was normal in size. There is no left ventricular hypertrophy. Left ventricular diastolic function could not be evaluated due to atrial fibrillation. Left ventricular diastolic function could not be evaluated. Right Ventricle: The right ventricular size is normal. Right vetricular wall thickness was not well visualized. Right ventricular systolic function is mildly reduced. There is mildly elevated pulmonary artery systolic pressure. The tricuspid regurgitant velocity is 2.88 m/s, and with an assumed right atrial pressure of 3 mmHg, the estimated right ventricular systolic pressure is 36.2 mmHg. Left Atrium: Left atrial size was severely dilated. Right Atrium: Right atrial size was not well visualized. Pericardium: The pericardium was not well visualized. Mitral Valve: The mitral valve is normal in structure. Mild mitral valve regurgitation. Tricuspid Valve: The tricuspid valve is normal in structure. Tricuspid valve regurgitation is mild. Aortic Valve: The aortic valve is calcified. Aortic valve regurgitation is not visualized. Aortic valve sclerosis/calcification is present, without any evidence of aortic stenosis. Pulmonic Valve: The pulmonic valve was not well visualized. Pulmonic valve regurgitation is not visualized. Aorta: The aortic root is normal in size and structure. Venous: The inferior vena cava was not well visualized. IAS/Shunts: The interatrial septum was not well visualized.   LEFT VENTRICLE PLAX 2D LVIDd:         5.00 cm      Diastology LVIDs:         3.30 cm      LV e' medial:  5.51 cm/s LV PW:         1.00 cm      LV e' lateral: 3.65 cm/s LV IVS:        0.90 cm LVOT diam:     1.80 cm LVOT Area:     2.54 cm  LV Volumes (MOD) LV vol d, MOD A4C: 116.0 ml LV vol s, MOD A4C: 52.1 ml LV SV MOD A4C:     116.0 ml RIGHT VENTRICLE            IVC RV Basal diam:  3.10 cm    IVC diam: 1.90 cm RV Mid diam:    2.60 cm RV S prime:     5.59 cm/s TAPSE (M-mode): 0.9 cm LEFT ATRIUM            Index        RIGHT ATRIUM           Index LA diam:      3.30 cm  1.78 cm/m   RA Area:     15.40 cm LA Vol (A4C): 123.0 ml 66.52 ml/m  RA Volume:   37.80 ml  20.44 ml/m   AORTA Ao Root diam: 2.70 cm TRICUSPID VALVE TR Peak grad:   33.2 mmHg TR Vmax:        288.00 cm/s  SHUNTS Systemic Diam: 1.80 cm Joelle Azobou Tonleu Electronically signed by Joelle Cedars Tonleu Signature Date/Time: 12/20/2023/1:17:54 PM    Final         Scheduled Meds:  apixaban   5 mg Oral BID   atorvastatin   10 mg Oral Daily   diltiazem   300 mg Oral Daily   magnesium  oxide  400 mg Oral Daily   Continuous Infusions:  cefTRIAXone  (ROCEPHIN )  IV 1 g (12/22/23 0842)     LOS: 3 days      Renato Applebaum, MD [facility]

## 2023-12-22 NOTE — Progress Notes (Signed)
 Mobility Specialist Progress Note:    12/22/23 1011  Mobility  Activity Ambulated with assistance (In hallway)  Level of Assistance Contact guard assist, steadying assist  Assistive Device Front wheel walker  Distance Ambulated (ft) 80 ft  Activity Response Tolerated well  Mobility Referral Yes  Mobility visit 1 Mobility  Mobility Specialist Start Time (ACUTE ONLY) [id number]  Mobility Specialist Stop Time (ACUTE ONLY) 1011  Mobility Specialist Time Calculation (min) (ACUTE ONLY) 18 min   Received pt in bed and agreeable to mobility. Pt required MinG for safety. No c/o. Max HR during ambulation was 140 bpm. Returned to room without fault. Left pt in bed with alarm on. Personal belongings and call light within reach. All needs met.  Lavanda Pollack Mobility Specialist  Please contact via [employer] or  Rehab Office [phone]

## 2023-12-23 DIAGNOSIS — I4891 Unspecified atrial fibrillation: Secondary | ICD-10-CM | POA: Diagnosis not present

## 2023-12-23 MED ORDER — METOPROLOL TARTRATE 25 MG PO TABS
25.0000 mg | ORAL_TABLET | Freq: Once | ORAL | Status: AC
  Administered 2023-12-23: 25 mg via ORAL
  Filled 2023-12-23: qty 1

## 2023-12-23 MED ORDER — METOPROLOL TARTRATE 25 MG PO TABS
25.0000 mg | ORAL_TABLET | Freq: Two times a day (BID) | ORAL | Status: DC
  Administered 2023-12-23 (×2): 25 mg via ORAL
  Filled 2023-12-23 (×2): qty 1

## 2023-12-23 NOTE — Progress Notes (Signed)
   12/23/23 2000  Assess: MEWS Score  Temp 99.4 F (37.4 C)  BP (!) 141/102  MAP (mmHg) 111  Pulse Rate (!) 109  ECG Heart Rate (!) 138  Resp 20  Level of Consciousness Alert  SpO2 98 %  O2 Device Room Air  Assess: MEWS Score  MEWS Temp 0  MEWS Systolic 0  MEWS Pulse 3  MEWS RR 0  MEWS LOC 0  MEWS Score 3  MEWS Score Color Yellow  Assess: if the MEWS score is Yellow or Red  Were vital signs accurate and taken at a resting state? Yes  Does the patient meet 2 or more of the SIRS criteria? No  Does the patient have a confirmed or suspected source of infection? No  MEWS guidelines implemented  No, previously yellow, continue vital signs every 4 hours (pt given scheduled Metoprolol  a little early, will recheck VS)  Provider Notification  Provider Name/Title [facility]  Date Provider Notified 12/23/23  Time Provider Notified 2018  Method of Notification  (secure chat)  Notification Reason Other (Comment) (HR continues to sustain 120-140's see progress note)  Assess: SIRS CRITERIA  SIRS Temperature  0  SIRS Respirations  0  SIRS Pulse 1  SIRS WBC 0  SIRS Score Sum  1

## 2023-12-23 NOTE — Progress Notes (Signed)
 Mobility Specialist Progress Note:   12/23/23 1003  Mobility  Activity Ambulated with assistance (In room)  Level of Assistance Contact guard assist, steadying assist  Assistive Device Front wheel walker  Distance Ambulated (ft) 25 ft  Activity Response Tolerated well  Mobility Referral Yes  Mobility visit 1 Mobility  Mobility Specialist Start Time (ACUTE ONLY) [id number]  Mobility Specialist Stop Time (ACUTE ONLY) 1003  Mobility Specialist Time Calculation (min) (ACUTE ONLY) 12 min   Received pt in bed and agreeable to mobility. Further mobility deferred d/t pt's max HR being 156 bpm. Pt had no c/o. Returned to bed. Personal belongings and call light within reach. RN aware.  Lavanda Pollack Mobility Specialist  Please contact via [employer] or  Rehab Office [phone]

## 2023-12-23 NOTE — Progress Notes (Signed)
 PROGRESS NOTE    [name]  FMW:[id number] DOB: 10-Dec-1924 DOA: 12/19/2023 PCP: Shayne Anes, MD    Brief Narrative:  89 year old from independent living at friend's home, history of A-fib, hypertension and osteoporosis presented to the ER with rapid A-fib.  Recently diagnosed with right sided pneumonia about 10 days ago and treated with Levaquin for 5 days.  Fever improved.  Seen at PCP office and found to have A-fib with RVR with heart rate anywhere from 120-180 and sent to the ER.  Received 20 mg of IV Cardizem  on the way to the ER.  In the emergency room electrolytes adequate.  Magnesium  1.6.  Troponins negative.  Lactic acid 1.9.  WBC 14.  Respiratory virus panel negative.  Chest x-ray with cardiomegaly, bibasilar atelectasis and right-sided basilar atelectasis.  Started on IV Rocephin  and azithromycin , IV Cardizem  infusion and admitted to the hospital.  Subjective:  Patient seen and examined.  Pleasant and interactive.  Denies any chest pain or shortness of breath.  Her heart rate was 135 and afebrile on my exam. Discussed with cardiology, will add metoprolol  25 mg twice daily along with the Cardizem .  Assessment & Plan:   A-fib with RVR: Paroxysmal A-fib. Treated with Cardizem  infusion.  Therapeutic on Eliquis .  Echo with normal EF.   Currently on Cardizem  300 mg daily, heart rate is not controlled.   Adding metoprolol  25 mg twice daily, will continue to increase the dose to adequately control heart rate.    Right lower lobe pneumonia: Likely atelectasis or remaining infection.  She was treated with 5 days of Levaquin.  Azithromycin  x 3 days Rocephin  x 5 days.  She will complete IV antibiotic therapy today. Continue to encourage chest physiotherapy and incentive spirometry.  Sputum cultures and blood cultures negative.   Hypomagnesemia: Replaced.  Will keep on oral replacement.  Hypertension: Losartan  on hold.  Blood pressure is stable.  To make room for increasing dose of  rate control medications , will discontinue losartan .    DVT prophylaxis:  apixaban  (ELIQUIS ) tablet 5 mg   Code Status: Full code Family Communication: None today. Disposition Plan: Status is: Inpatient Remains inpatient appropriate because: IV antibiotics, heart rate controlled.  Likely SNF tomorrow.     Consultants:  Cardiology  Procedures:  None  Antimicrobials:  Rocephin  azithromycin  12/3---     Objective: Vitals:   12/22/23 2349 12/23/23 0315 12/23/23 0646 12/23/23 0828  BP: (!) 148/95 119/79  (!) 158/99  Pulse: (!) 123 (!) 123 (!) 130 (!) 141  Resp: 19 20  20   Temp: 99.1 F (37.3 C) 98.5 F (36.9 C)  98.5 F (36.9 C)  TempSrc: Oral Oral  Oral  SpO2: 98% 96% 98% 95%  Weight:      Height:        Intake/Output Summary (Last 24 hours) at 12/23/2023 1103 Last data filed at 12/22/2023 2000 Gross per 24 hour  Intake 120 ml  Output --  Net 120 ml   Filed Weights   12/19/23 1117  Weight: 81.6 kg    Examination:  General exam: Pleasant and interactive.  Not in any distress. Respiratory system: Clear to auscultation. Respiratory effort normal.  On room air. Cardiovascular system: S1 & S2 heard, irregularly irregular.  Tachycardic.  No pedal edema. Gastrointestinal system: Soft.  Nontender.  Bowel sound present.   Central nervous system: Alert and oriented. No focal neurological deficits. Extremities: Symmetric 5 x 5 power. Skin: No rashes, lesions or ulcers    Data Reviewed: I  have personally reviewed following labs and imaging studies  CBC: Recent Labs  Lab 12/19/23 1142 12/19/23 1155 12/21/23 0357  WBC 14.1*  --  12.5*  NEUTROABS 12.4*  --  8.8*  HGB 11.6* 12.9 11.2*  HCT 36.9 38.0 36.0  MCV 86.4  --  85.7  PLT 808*  --  676*   Basic Metabolic Panel: Recent Labs  Lab 12/19/23 1142 12/19/23 1155 12/20/23 0817 12/21/23 0357  NA 139 140 141 140  K 4.5 4.4 4.4 4.1  CL 103 103 107 104  CO2 25  --  23 25  GLUCOSE 173* 168* 131* 139*   BUN 25* 28* 27* 25*  CREATININE 1.31* 1.30* 1.24* 1.18*  CALCIUM  9.7  --  9.2 8.9  MG 1.6*  --  2.2 2.0   GFR: Estimated Creatinine Clearance: 27.6 mL/min (A) (by C-G formula based on SCr of 1.18 mg/dL (H)). Liver Function Tests: Recent Labs  Lab 12/19/23 1142 12/21/23 0357  AST 48* 27  ALT 36 24  ALKPHOS 62 61  BILITOT 1.0 1.0  PROT 5.8* 5.7*  ALBUMIN 2.8* 2.6*   No results for input(s): LIPASE, AMYLASE in the last 168 hours. No results for input(s): AMMONIA in the last 168 hours. Coagulation Profile: No results for input(s): INR, PROTIME in the last 168 hours. Cardiac Enzymes: No results for input(s): CKTOTAL, CKMB, CKMBINDEX, TROPONINI in the last 168 hours. BNP (last 3 results) No results for input(s): PROBNP in the last 8760 hours. HbA1C: No results for input(s): HGBA1C in the last 72 hours. CBG: No results for input(s): GLUCAP in the last 168 hours. Lipid Profile: No results for input(s): CHOL, HDL, LDLCALC, TRIG, CHOLHDL, LDLDIRECT in the last 72 hours. Thyroid  Function Tests: No results for input(s): TSH, T4TOTAL, FREET4, T3FREE, THYROIDAB in the last 72 hours. Anemia Panel: No results for input(s): VITAMINB12, FOLATE, FERRITIN, TIBC, IRON, RETICCTPCT in the last 72 hours. Sepsis Labs: Recent Labs  Lab 12/19/23 1156 12/19/23 1347  LATICACIDVEN 1.9 1.3    Recent Results (from the past 240 hours)  Culture, blood (routine x 2)     Status: None (Preliminary result)   Collection Time: 12/19/23 11:14 AM   Specimen: BLOOD  Result Value Ref Range Status   Specimen Description BLOOD RIGHT ANTECUBITAL  Final   Special Requests   Final    BOTTLES DRAWN AEROBIC AND ANAEROBIC Blood Culture adequate volume   Culture   Final    NO GROWTH 4 DAYS Performed at [facility], [address] [place], [place] [address]    Report Status PENDING  Incomplete  Resp panel by RT-PCR (RSV, Flu A&B, Covid) Anterior  Nasal Swab     Status: None   Collection Time: 12/19/23 11:42 AM   Specimen: Anterior Nasal Swab  Result Value Ref Range Status   SARS Coronavirus 2 by RT PCR NEGATIVE NEGATIVE Final   Influenza A by PCR NEGATIVE NEGATIVE Final   Influenza B by PCR NEGATIVE NEGATIVE Final    Comment: (NOTE) The Xpert Xpress SARS-CoV-2/FLU/RSV plus assay is intended as an aid in the diagnosis of influenza from Nasopharyngeal swab specimens and should not be used as a sole basis for treatment. Nasal washings and aspirates are unacceptable for Xpert Xpress SARS-CoV-2/FLU/RSV testing.  Fact Sheet for Patients: bloggercourse.com  Fact Sheet for Healthcare Providers: seriousbroker.it  This test is not yet approved or cleared by the [place]  FDA and has been authorized for detection and/or diagnosis of SARS-CoV-2 by FDA under an  Emergency Use Authorization (EUA). This EUA will remain in effect (meaning this test can be used) for the duration of the COVID-19 declaration under Section 564(b)(1) of the Act, 21 U.S.C. section 360bbb-3(b)(1), unless the authorization is terminated or revoked.     Resp Syncytial Virus by PCR NEGATIVE NEGATIVE Final    Comment: (NOTE) Fact Sheet for Patients: bloggercourse.com  Fact Sheet for Healthcare Providers: seriousbroker.it  This test is not yet approved or cleared by the [place]  FDA and has been authorized for detection and/or diagnosis of SARS-CoV-2 by FDA under an Emergency Use Authorization (EUA). This EUA will remain in effect (meaning this test can be used) for the duration of the COVID-19 declaration under Section 564(b)(1) of the Act, 21 U.S.C. section 360bbb-3(b)(1), unless the authorization is terminated or revoked.  Performed at [facility], [address], [place], [place] [address]   Culture, blood (routine x 2)     Status: None  (Preliminary result)   Collection Time: 12/19/23 11:57 AM   Specimen: BLOOD LEFT HAND  Result Value Ref Range Status   Specimen Description BLOOD LEFT HAND  Final   Special Requests   Final    BOTTLES DRAWN AEROBIC AND ANAEROBIC Blood Culture results may not be optimal due to an inadequate volume of blood received in culture bottles   Culture   Final    NO GROWTH 4 DAYS Performed at [facility], [address] [place], [place] [address]    Report Status PENDING  Incomplete         Radiology Studies: No results found.       Scheduled Meds:  apixaban   5 mg Oral BID   atorvastatin   10 mg Oral Daily   diltiazem   300 mg Oral Daily   magnesium  oxide  400 mg Oral Daily   metoprolol  tartrate  25 mg Oral BID   Continuous Infusions:     LOS: 4 days      Renato Applebaum, MD [facility]

## 2023-12-23 NOTE — Plan of Care (Signed)

## 2023-12-23 NOTE — Plan of Care (Signed)
  Problem: Clinical Measurements: Goal: Will remain free from infection Outcome: Progressing Goal: Respiratory complications will improve Outcome: Progressing   Problem: Activity: Goal: Risk for activity intolerance will decrease Outcome: Progressing   Problem: Nutrition: Goal: Adequate nutrition will be maintained Outcome: Progressing   Problem: Coping: Goal: Level of anxiety will decrease Outcome: Progressing   Problem: Safety: Goal: Ability to remain free from injury will improve Outcome: Progressing

## 2023-12-23 NOTE — Progress Notes (Addendum)
 Pts HR continues to sustain 120-140's. Pt is asymptomatic. She was started on PO Metoprolol  25mg  BID this morning. I just gave PM dose. Dr Franky notified by secure chat.  2055 HR improved to Afib 105-117, Pt continues to be asymptomatic. EKG obtained per MD orders.   2130 MD ordered Metoprolol  25mg  x1 additional dose. Given per orders

## 2023-12-24 ENCOUNTER — Inpatient Hospital Stay (HOSPITAL_COMMUNITY)

## 2023-12-24 DIAGNOSIS — J189 Pneumonia, unspecified organism: Secondary | ICD-10-CM | POA: Diagnosis not present

## 2023-12-24 DIAGNOSIS — R918 Other nonspecific abnormal finding of lung field: Secondary | ICD-10-CM | POA: Diagnosis not present

## 2023-12-24 DIAGNOSIS — I517 Cardiomegaly: Secondary | ICD-10-CM | POA: Diagnosis not present

## 2023-12-24 DIAGNOSIS — J90 Pleural effusion, not elsewhere classified: Secondary | ICD-10-CM | POA: Diagnosis not present

## 2023-12-24 LAB — CULTURE, BLOOD (ROUTINE X 2)
Culture: NO GROWTH
Culture: NO GROWTH
Special Requests: ADEQUATE

## 2023-12-24 LAB — BASIC METABOLIC PANEL WITH GFR
Anion gap: 6 (ref 5–15)
BUN: 15 mg/dL (ref 8–23)
CO2: 23 mmol/L (ref 22–32)
Calcium: 8.3 mg/dL — ABNORMAL LOW (ref 8.9–10.3)
Chloride: 106 mmol/L (ref 98–111)
Creatinine, Ser: 1 mg/dL (ref 0.44–1.00)
GFR, Estimated: 51 mL/min — ABNORMAL LOW (ref >60)
Glucose, Bld: 180 mg/dL — ABNORMAL HIGH (ref 70–99)
Potassium: 4.3 mmol/L (ref 3.5–5.1)
Sodium: 135 mmol/L (ref 135–145)

## 2023-12-24 LAB — CBC
HCT: 40.2 % (ref 36.0–46.0)
Hemoglobin: 12.9 g/dL (ref 12.0–15.0)
MCH: 27.2 pg (ref 26.0–34.0)
MCHC: 32.1 g/dL (ref 30.0–36.0)
MCV: 84.8 fL (ref 80.0–100.0)
Platelets: 584 K/uL — ABNORMAL HIGH (ref 150–400)
RBC: 4.74 MIL/uL (ref 3.87–5.11)
RDW: 14.9 % (ref 11.5–15.5)
WBC: 19.7 K/uL — ABNORMAL HIGH (ref 4.0–10.5)
nRBC: 0 % (ref 0.0–0.2)

## 2023-12-24 LAB — MAGNESIUM: Magnesium: 2.1 mg/dL (ref 1.7–2.4)

## 2023-12-24 LAB — PROCALCITONIN: Procalcitonin: <0.1 ng/mL

## 2023-12-24 LAB — TSH: TSH: 1.371 u[IU]/mL (ref 0.350–4.500)

## 2023-12-24 MED ORDER — DILTIAZEM HCL-DEXTROSE 125-5 MG/125ML-% IV SOLN (PREMIX)
5.0000 mg/h | INTRAVENOUS | Status: DC
  Administered 2023-12-24: 5 mg/h via INTRAVENOUS
  Filled 2023-12-24: qty 125

## 2023-12-24 MED ORDER — METOPROLOL TARTRATE 50 MG PO TABS
50.0000 mg | ORAL_TABLET | Freq: Two times a day (BID) | ORAL | Status: DC
  Administered 2023-12-24 – 2023-12-25 (×3): 50 mg via ORAL
  Filled 2023-12-24 (×3): qty 1

## 2023-12-24 MED ORDER — DILTIAZEM HCL 60 MG PO TABS
120.0000 mg | ORAL_TABLET | Freq: Three times a day (TID) | ORAL | Status: DC
  Administered 2023-12-24 – 2023-12-25 (×4): 120 mg via ORAL
  Filled 2023-12-24 (×4): qty 2

## 2023-12-24 NOTE — Progress Notes (Addendum)
 Physical Therapy Treatment Patient Details Name: [name] MRN: [id number] DOB: March 05, 1924 Today's Date: 12/24/2023   History of Present Illness 89 y.o. female admitted 12/19/23 for Afib with RVR. Chest x-ray with cardiomegaly, bibasilar atelectasis and right-sided basilar atelectasis. Of note, diagnosed with right-sided PNA ~10 days ago and treated with Levaquin for 5 days. PMHx: A-fib, HTN, and osteoporosis.    PT Comments  Pt admitted with above diagnosis. Pt was able to ambulate with RW into hallway with CGA.  HR to 114 bpm but this is better than earlier HR. Pt could only tolerate ambulation 145 feet prior to needing to rest. PEr daughter, pt has to walk over 1000 feet to the dining room at [facility]. Given this, agree with recommendation for post acute rehab < 3 hours day so that pt can gain strength and endurance for d/c back to I living.   Pt currently with functional limitations due to the deficits listed below (see PT Problem List). Pt will benefit from acute skilled PT to increase their independence and safety with mobility to allow discharge.       If plan is discharge home, recommend the following: A little help with walking and/or transfers;A little help with bathing/dressing/bathroom;Assistance with cooking/housework;Assist for transportation;Help with stairs or ramp for entrance   Can travel by private vehicle     Yes  Equipment Recommendations  Other (comment) (Daughter states [facility] working on getting pt a power wheelchair)    Recommendations for Other Services       Precautions / Restrictions Precautions Precautions: Fall Recall of Precautions/Restrictions: Intact Restrictions [employer] Bearing Restrictions Per Provider Order: No     Mobility  Bed Mobility               General bed mobility comments: Pt in chair on arrival.    Transfers Overall transfer level: Needs assistance Equipment used: Rolling walker (2 wheels) Transfers: Sit to/from  Stand Sit to Stand: Contact guard assist           General transfer comment: Pt stood from recliner. She pushed up with BUE support. Pt feels confident with  RW and educated pt on proper and safe use of AD. Cued proper hand placement. She powered up with CGA. Good eccentric control once back to room.    Ambulation/Gait Ambulation/Gait assistance: Contact guard assist Gait Distance (Feet): 145 Feet Assistive device: Rolling walker (2 wheels) Gait Pattern/deviations: Step-through pattern, Decreased stride length, Trunk flexed Gait velocity: reduced Gait velocity interpretation: <1.8 ft/sec, indicate of risk for recurrent falls   General Gait Details: Pt ambulated with a slow step through gait pattern and slight fwd lean. Cues for upright posture and close proximity to RW. Pt was able to adapt well. She took symmetrical steps. Pt navigated room/hallway well with intermittent cues, no LOB. Discussed using RW for increased support currently and pt agreeable. Pt with decr endurance as ddaughter present and states pt has to walk over 1000 feet to dining room for meals and pt only able to comfortably walk 145 feet today with RW.  HR 73-114 bpm with activity.  Pt 94% on RA.  Pt would benefit from working on endurance.   Stairs             Wheelchair Mobility     Tilt Bed    Modified Rankin (Stroke Patients Only)       Balance Overall balance assessment: Needs assistance Sitting-balance support: Bilateral upper extremity supported, Feet supported, No upper extremity supported Sitting balance-Leahy  Scale: Fair     Standing balance support: Bilateral upper extremity supported, During functional activity, Reliant on assistive device for balance Standing balance-Leahy Scale: Poor Standing balance comment: currently relies on UE support on RW for safety.                            Communication Communication Communication: Impaired Factors Affecting Communication:  Hearing impaired  Cognition Arousal: Alert Behavior During Therapy: WFL for tasks assessed/performed   PT - Cognitive impairments: No apparent impairments                       PT - Cognition Comments: Pt A,Ox4 Following commands: Intact      Cueing Cueing Techniques: Verbal cues, Gestural cues  Exercises General Exercises - Lower Extremity Long Arc Quad: AROM, Both, 10 reps, Seated Hip Flexion/Marching: AROM, Both, 10 reps, Standing    General Comments General comments (skin integrity, edema, etc.): Sitting BP 127/88, 73 bpm; standing 125/108, 98 bpm;  walking 114 bpm with MAP 116.      Pertinent Vitals/Pain Pain Assessment Pain Assessment: No/denies pain    Home Living                          Prior Function            PT Goals (current goals can now be found in the care plan section) Acute Rehab PT Goals Patient Stated Goal: Return Home Progress towards PT goals: Progressing toward goals    Frequency    Min 2X/week      PT Plan      Co-evaluation              AM-PAC PT 6 Clicks Mobility   Outcome Measure  Help needed turning from your back to your side while in a flat bed without using bedrails?: A Little Help needed moving from lying on your back to sitting on the side of a flat bed without using bedrails?: A Little Help needed moving to and from a bed to a chair (including a wheelchair)?: A Little Help needed standing up from a chair using your arms (e.g., wheelchair or bedside chair)?: A Little Help needed to walk in hospital room?: A Little Help needed climbing 3-5 steps with a railing? : Total 6 Click Score: 16    End of Session Equipment Utilized During Treatment: Gait belt Activity Tolerance: Patient tolerated treatment well Patient left: with call bell/phone within reach;in chair;with chair alarm set;with family/visitor present Nurse Communication: Mobility status;Other (comment) (HR response during session) PT  Visit Diagnosis: Muscle weakness (generalized) (M62.81);Difficulty in walking, not elsewhere classified (R26.2);Unsteadiness on feet (R26.81)     Time: 8885-8864 PT Time Calculation (min) (ACUTE ONLY): 21 min  Charges:    $Gait Training: 8-22 mins PT General Charges $$ ACUTE PT VISIT: 1 Visit                     Damiean Lukes M,PT Acute Rehab Services [phone]    Stephane JULIANNA Bevel 12/24/2023, 1:42 PM

## 2023-12-24 NOTE — Progress Notes (Signed)
 PROGRESS NOTE    Young Brim  FMW:[id number] DOB: Dec 25, 1924 DOA: 12/19/2023 PCP: Shayne Anes, MD    Brief Narrative:  89 year old [facility], history of A-fib, hypertension and osteoporosis presented to the ER with rapid A-fib.  Recently diagnosed with right sided pneumonia about 10 days ago and treated with Levaquin for 5 days.  Fever improved.  Seen at PCP office and found to have A-fib with RVR with heart rate anywhere from 120-180 and sent to the ER.  Received 20 mg of IV Cardizem  on the way to the ER.  In the emergency room electrolytes adequate.  Magnesium  1.6.  Troponins negative.  Lactic acid 1.9.  WBC 14.  Respiratory virus panel negative.  Chest x-ray with cardiomegaly, bibasilar atelectasis and right-sided basilar atelectasis.  Started on IV Rocephin  and azithromycin , IV Cardizem  infusion and admitted to the hospital.  Subjective:  Patient seen and examined.  Patient herself denies any complaints.  Daughter at the bedside. Overnight events noted. Her resting heart rate was as high as 140-160.  Started on Cardizem  infusion. Patient denies any chest pain or shortness of breath or palpitations.  Increase metoprolol  to 50 mg twice daily, Cardizem  120 mg 3 times daily.  Hopefully we can wean off Cardizem  drip.  I have asked cardiology service to reengage.  May need amiodarone loading.  No  Assessment & Plan:   A-fib with RVR: Paroxysmal A-fib. Initially treated with Cardizem  infusion.  Echo with normal EF. Started on oral Cardizem  and metoprolol , heart rate uncontrolled with resting heart rate more than 140. Cardizem  120 mg 3 times daily Metoprolol  50 mg twice daily no surgical Keep on Cardizem  infusion, currently on.  7.5 mg/h.   Conservative management advised, however will need further heart rate control before discharging. Cardiology reconsulted.  Right lower lobe pneumonia: Likely atelectasis or remaining infection.  She was treated with 5  days of Levaquin.  Azithromycin  x 3 days Rocephin  x 5 days.  Completed antibiotics.  Symptomatically improved.  Continue to encourage chest physiotherapy and incentive spirometry.  Sputum cultures and blood cultures negative.   Hypomagnesemia: Replaced.  Will keep on oral replacement.  Hypertension: Losartan  on hold.  Blood pressure is stable.  To make room for increasing dose of rate control medications , will discontinue losartan .    DVT prophylaxis:  apixaban  (ELIQUIS ) tablet 5 mg   Code Status: Full code Family Communication: Daughter at the bedside. Disposition Plan: Status is: Inpatient Remains inpatient appropriate because: To achieve heart rate control.  On IV infusion.      Consultants:  Cardiology  Procedures:  None  Antimicrobials:  Rocephin  azithromycin  12/3---     Objective: Vitals:   12/24/23 0608 12/24/23 0638 12/24/23 0746 12/24/23 0755  BP: 127/80 (!) 145/97 (!) 155/82 (!) 145/122  Pulse: 64 (!) 131  (!) 121  Resp: 20 (!) 23  (!) 21  Temp:   97.9 F (36.6 C) 99 F (37.2 C)  TempSrc:   Oral Oral  SpO2: 95% 94%  96%  Weight:      Height:       No intake or output data in the 24 hours ending 12/24/23 1022  Filed Weights   12/19/23 1117  Weight: 81.6 kg    Examination:  General exam: Pleasant and interactive.  Not in any distress. On room air.  Respiratory system: Clear to auscultation. Respiratory effort normal.  On room air. Cardiovascular system: S1 & S2 heard, irregularly irregular.  Tachycardic.  No pedal  edema. Gastrointestinal system: Soft.  Nontender.  Bowel sound present.   Central nervous system: Alert and oriented. No focal neurological deficits. Extremities: Symmetric 5 x 5 power. Skin: No rashes, lesions or ulcers    Data Reviewed: I have personally reviewed following labs and imaging studies  CBC: Recent Labs  Lab 12/19/23 1142 12/19/23 1155 12/21/23 0357  WBC 14.1*  --  12.5*  NEUTROABS 12.4*  --  8.8*  HGB 11.6*  12.9 11.2*  HCT 36.9 38.0 36.0  MCV 86.4  --  85.7  PLT 808*  --  676*   Basic Metabolic Panel: Recent Labs  Lab 12/19/23 1142 12/19/23 1155 12/20/23 0817 12/21/23 0357  NA 139 140 141 140  K 4.5 4.4 4.4 4.1  CL 103 103 107 104  CO2 25  --  23 25  GLUCOSE 173* 168* 131* 139*  BUN 25* 28* 27* 25*  CREATININE 1.31* 1.30* 1.24* 1.18*  CALCIUM  9.7  --  9.2 8.9  MG 1.6*  --  2.2 2.0   GFR: Estimated Creatinine Clearance: 27.6 mL/min (A) (by C-G formula based on SCr of 1.18 mg/dL (H)). Liver Function Tests: Recent Labs  Lab 12/19/23 1142 12/21/23 0357  AST 48* 27  ALT 36 24  ALKPHOS 62 61  BILITOT 1.0 1.0  PROT 5.8* 5.7*  ALBUMIN 2.8* 2.6*   No results for input(s): LIPASE, AMYLASE in the last 168 hours. No results for input(s): AMMONIA in the last 168 hours. Coagulation Profile: No results for input(s): INR, PROTIME in the last 168 hours. Cardiac Enzymes: No results for input(s): CKTOTAL, CKMB, CKMBINDEX, TROPONINI in the last 168 hours. BNP (last 3 results) No results for input(s): PROBNP in the last 8760 hours. HbA1C: No results for input(s): HGBA1C in the last 72 hours. CBG: No results for input(s): GLUCAP in the last 168 hours. Lipid Profile: No results for input(s): CHOL, HDL, LDLCALC, TRIG, CHOLHDL, LDLDIRECT in the last 72 hours. Thyroid  Function Tests: No results for input(s): TSH, T4TOTAL, FREET4, T3FREE, THYROIDAB in the last 72 hours. Anemia Panel: No results for input(s): VITAMINB12, FOLATE, FERRITIN, TIBC, IRON, RETICCTPCT in the last 72 hours. Sepsis Labs: Recent Labs  Lab 12/19/23 1156 12/19/23 1347  LATICACIDVEN 1.9 1.3    Recent Results (from the past 240 hours)  Culture, blood (routine x 2)     Status: None   Collection Time: 12/19/23 11:14 AM   Specimen: BLOOD  Result Value Ref Range Status   Specimen Description BLOOD RIGHT ANTECUBITAL  Final   Special Requests   Final     BOTTLES DRAWN AEROBIC AND ANAEROBIC Blood Culture adequate volume   Culture   Final    NO GROWTH 5 DAYS Performed at [facility], [address] [place], [place] [address]    Report Status 12/24/2023 FINAL  Final  Resp panel by RT-PCR (RSV, Flu A&B, Covid) Anterior Nasal Swab     Status: None   Collection Time: 12/19/23 11:42 AM   Specimen: Anterior Nasal Swab  Result Value Ref Range Status   SARS Coronavirus 2 by RT PCR NEGATIVE NEGATIVE Final   Influenza A by PCR NEGATIVE NEGATIVE Final   Influenza B by PCR NEGATIVE NEGATIVE Final    Comment: (NOTE) The Xpert Xpress SARS-CoV-2/FLU/RSV plus assay is intended as an aid in the diagnosis of influenza from Nasopharyngeal swab specimens and should not be used as a sole basis for treatment. Nasal washings and aspirates are unacceptable for Xpert Xpress SARS-CoV-2/FLU/RSV testing.  Fact  Sheet for Patients: bloggercourse.com  Fact Sheet for Healthcare Providers: seriousbroker.it  This test is not yet approved or cleared by the [place]  FDA and has been authorized for detection and/or diagnosis of SARS-CoV-2 by FDA under an Emergency Use Authorization (EUA). This EUA will remain in effect (meaning this test can be used) for the duration of the COVID-19 declaration under Section 564(b)(1) of the Act, 21 U.S.C. section 360bbb-3(b)(1), unless the authorization is terminated or revoked.     Resp Syncytial Virus by PCR NEGATIVE NEGATIVE Final    Comment: (NOTE) Fact Sheet for Patients: bloggercourse.com  Fact Sheet for Healthcare Providers: seriousbroker.it  This test is not yet approved or cleared by the [place]  FDA and has been authorized for detection and/or diagnosis of SARS-CoV-2 by FDA under an Emergency Use Authorization (EUA). This EUA will remain in effect (meaning this test can be used) for the duration of  the COVID-19 declaration under Section 564(b)(1) of the Act, 21 U.S.C. section 360bbb-3(b)(1), unless the authorization is terminated or revoked.  Performed at [facility], [address], [place], [place] [address]   Culture, blood (routine x 2)     Status: None   Collection Time: 12/19/23 11:57 AM   Specimen: BLOOD LEFT HAND  Result Value Ref Range Status   Specimen Description BLOOD LEFT HAND  Final   Special Requests   Final    BOTTLES DRAWN AEROBIC AND ANAEROBIC Blood Culture results may not be optimal due to an inadequate volume of blood received in culture bottles   Culture   Final    NO GROWTH 5 DAYS Performed at [facility], [address], [place], [place] [address]    Report Status 12/24/2023 FINAL  Final         Radiology Studies: No results found.       Scheduled Meds:  apixaban   5 mg Oral BID   atorvastatin   10 mg Oral Daily   diltiazem   120 mg Oral Q8H   magnesium  oxide  400 mg Oral Daily   metoprolol  tartrate  50 mg Oral BID   Continuous Infusions:     LOS: 5 days      Renato Applebaum, MD [facility]

## 2023-12-24 NOTE — Significant Event (Signed)
 Patient overnight had multiple episodes of persistent tachycardia A-fib with RVR despite giving 2 dose of p.o. metoprolol .  Blood pressure is 112/97.  Reviewed recent 2D echo.  Will start patient on Cardizem  infusion.  Redia Cleaver MD.

## 2023-12-24 NOTE — Progress Notes (Addendum)
 Pt ambulated to BR. HR up to 160's (Afib)while OOB. Continues to be asymptomatic.    0510 HR hanging out 130-140's since back to bed. BP 112/97. She remains Asymptomatic. Provider on call(Dr Franky) notified with orders to restart Cardizem  gtt.

## 2023-12-24 NOTE — Plan of Care (Signed)

## 2023-12-24 NOTE — NC FL2 (Signed)
  [place]  MEDICAID FL2 LEVEL OF CARE FORM     IDENTIFICATION  Patient Name: [name] Birthdate: 1924-02-14 Sex: female Admission Date (Current Location): 12/19/2023  [facility] and [place] Number:  Producer, Television/film/video and Address:  [facility] [place], [address] [place], [place] [address]      Provider Number: [id number]  Attending Physician Name and Address:  Raenelle Coria, MD  Relative Name and Phone Number:  Jennae Hakeem  [phone]    Current Level of Care: Hospital Recommended Level of Care: Skilled Nursing Facility Prior Approval Number:    Date Approved/Denied:   PASRR Number: [id number]  Discharge Plan: SNF    Current Diagnoses: Patient Active Problem List   Diagnosis Date Noted   Atrial fibrillation with rapid ventricular response (HCC) 12/19/2023   OP (osteoporosis) 11/20/2023   Paroxysmal atrial fibrillation (HCC) 04/16/2023   Hypercoagulable state due to paroxysmal atrial fibrillation (HCC) 04/16/2023   ABDOMINAL PAIN, RIGHT UPPER QUADRANT 10/29/2008   HYPOTHYROIDISM 12/25/2006   HYPERLIPIDEMIA 12/25/2006   ALLERGIC RHINITIS 12/25/2006   URINARY INCONTINENCE 12/25/2006   POSITIVE PPD 12/25/2006    Orientation RESPIRATION BLADDER Height & Weight     Self, Time, Situation, Place  Normal Continent Weight: 180 lb (81.6 kg) Height:  5' 3 (160 cm)  BEHAVIORAL SYMPTOMS/MOOD NEUROLOGICAL BOWEL NUTRITION STATUS      Continent Diet (Please see discharge summary)  AMBULATORY STATUS COMMUNICATION OF NEEDS Skin   Limited Assist Verbally Other (Comment) (WDL,Wound/Incision LDAs)                       Personal Care Assistance Level of Assistance  Bathing, Feeding, Dressing Bathing Assistance: Limited assistance Feeding assistance: Independent Dressing Assistance: Limited assistance     Functional Limitations Info  Sight, Hearing, Speech Sight Info: Impaired Hearing Info: Impaired Speech Info: Adequate     SPECIAL CARE FACTORS FREQUENCY  PT (By licensed PT), OT (By licensed OT)     PT Frequency: 5x min weekly OT Frequency: 5x min weekly            Contractures Contractures Info: Not present    Additional Factors Info  Code Status, Allergies Code Status Info: FULL Allergies Info: Estrogens,Ezetimibe-simvastatin,Mite (d. Farinae),Simvastatin           Current Medications (12/24/2023):  This is the current hospital active medication list Current Facility-Administered Medications  Medication Dose Route Frequency Provider Last Rate Last Admin   apixaban  (ELIQUIS ) tablet 5 mg  5 mg Oral BID Georgina Basket, MD   5 mg at 12/24/23 0804   atorvastatin  (LIPITOR) tablet 10 mg  10 mg Oral Daily Georgina Basket, MD   10 mg at 12/24/23 0803   diltiazem  (CARDIZEM ) tablet 120 mg  120 mg Oral Q8H Ghimire, Kuber, MD   120 mg at 12/24/23 0803   magnesium  oxide (MAG-OX) tablet 400 mg  400 mg Oral Daily Georgina Basket, MD   400 mg at 12/24/23 0803   metoprolol  tartrate (LOPRESSOR ) tablet 50 mg  50 mg Oral BID Ghimire, Kuber, MD   50 mg at 12/24/23 0803     Discharge Medications: Please see discharge summary for a list of discharge medications.  Relevant Imaging Results:  Relevant Lab Results:   Additional Information [id number]  [name], LCSWA

## 2023-12-24 NOTE — TOC Progression Note (Addendum)
 Transition of Care [facility]) - Progression Note    Patient Details  Name: [name] MRN: [id number] Date of Birth: 07-31-1924  [facility]) CM/SW Contact  [name], LCSWA Phone Number: 12/24/2023, 2:50 PM  Clinical Narrative:      CSW spoke with patient at bedside. Patient confirmed her plan is to get some rehab at [facility] when ready for dc.Patient informed CSW that she plans on transporting to facility by her daughter Deane.  Lonell with [facility] confirmed they have a bed for patient when medically ready. CSW updated patients daughter Deane.CSW will continue to follow.                   Expected Discharge Plan and Services                                               Social Drivers of Health (SDOH) Interventions SDOH Screenings   Food Insecurity: No Food Insecurity (12/19/2023)  Housing: Low Risk  (12/19/2023)  Transportation Needs: No Transportation Needs (12/19/2023)  Utilities: Not At Risk (12/19/2023)  Social Connections: Moderately Isolated (12/19/2023)  Tobacco Use: Low Risk  (12/20/2023)    Readmission Risk Interventions     No data to display

## 2023-12-24 NOTE — Progress Notes (Addendum)
 Progress Note  Patient Name: [name] Date of Encounter: 12/24/2023  Primary Cardiologist: Arun K Thukkani, MD  Subjective   Denies CP or SOB. Still fatigued. Ate a good breakfast this AM. Overnight issues with persistent RVR noted with med changes as outlined. HR then decreased to the 40s this AM, diltiazem  drip stopped. HR currently 50s-70s.  Inpatient Medications    Scheduled Meds:  apixaban   5 mg Oral BID   atorvastatin   10 mg Oral Daily   diltiazem   120 mg Oral Q8H   magnesium  oxide  400 mg Oral Daily   metoprolol  tartrate  50 mg Oral BID   Continuous Infusions:   PRN Meds:    Vital Signs    Vitals:   12/24/23 0608 12/24/23 0638 12/24/23 0746 12/24/23 0755  BP: 127/80 (!) 145/97 (!) 155/82 (!) 145/122  Pulse: 64 (!) 131  (!) 121  Resp: 20 (!) 23  (!) 21  Temp:   97.9 F (36.6 C) 99 F (37.2 C)  TempSrc:   Oral Oral  SpO2: 95% 94%  96%  Weight:      Height:       No intake or output data in the 24 hours ending 12/24/23 1037    12/19/2023   11:17 AM 07/03/2023   10:07 AM 04/16/2023    1:48 PM  Last 3 Weights  Weight (lbs) 180 lb 181 lb 9.6 oz 179 lb  Weight (kg) 81.647 kg 82.373 kg 81.194 kg     Telemetry    AF with variable rate - Personally Reviewed  Physical Exam   GEN: No acute distress.  HEENT: Normocephalic, atraumatic, sclera non-icteric. Neck: No JVD or bruits. Cardiac: Irregularly irregular, normal rate, no murmurs, rubs, or gallops.  Respiratory: Diffusely diminished bilaterally to auscultation without clear wheezing, rales or rhonchi. Breathing is unlabored. GI: Soft, nontender, non-distended, BS +x 4. MS: no deformity. Extremities: No clubbing or cyanosis. No edema. Distal pedal pulses are 2+ and equal bilaterally. Neuro:  AAOx3. Follows commands. HOH without hearing aids in Psych:  Responds to questions appropriately with a normal affect.  Labs    High Sensitivity Troponin:   Recent Labs  Lab 12/19/23 1142 12/19/23 1345   TROPONINIHS 14 12      Cardiac EnzymesNo results for input(s): TROPONINI in the last 168 hours. No results for input(s): TROPIPOC in the last 168 hours.   Chemistry Recent Labs  Lab 12/19/23 1142 12/19/23 1155 12/20/23 0817 12/21/23 0357  NA 139 140 141 140  K 4.5 4.4 4.4 4.1  CL 103 103 107 104  CO2 25  --  23 25  GLUCOSE 173* 168* 131* 139*  BUN 25* 28* 27* 25*  CREATININE 1.31* 1.30* 1.24* 1.18*  CALCIUM  9.7  --  9.2 8.9  PROT 5.8*  --   --  5.7*  ALBUMIN 2.8*  --   --  2.6*  AST 48*  --   --  27  ALT 36  --   --  24  ALKPHOS 62  --   --  61  BILITOT 1.0  --   --  1.0  GFRNONAA 37*  --  40* 42*  ANIONGAP 11  --  11 11     Hematology Recent Labs  Lab 12/19/23 1142 12/19/23 1155 12/21/23 0357  WBC 14.1*  --  12.5*  RBC 4.27  --  4.20  HGB 11.6* 12.9 11.2*  HCT 36.9 38.0 36.0  MCV 86.4  --  85.7  MCH 27.2  --  26.7  MCHC 31.4  --  31.1  RDW 14.9  --  15.3  PLT 808*  --  676*    BNP Recent Labs  Lab 12/19/23 1142  BNP 305.2*     DDimer No results for input(s): DDIMER in the last 168 hours.   Radiology    No results found.  Cardiac Studies   2d echo 12/20/23  1. Left ventricular ejection fraction, by estimation, is 55 to 60%. The  left ventricle has normal function. Left ventricular endocardial border  not optimally defined to evaluate regional wall motion. Left ventricular  diastolic function could not be  evaluated.   2. Right ventricular systolic function is mildly reduced. The right  ventricular size is normal. There is mildly elevated pulmonary artery  systolic pressure.   3. Left atrial size was severely dilated.   4. The mitral valve is normal in structure. Mild mitral valve  regurgitation.   5. The aortic valve is calcified. Aortic valve regurgitation is not  visualized. Aortic valve sclerosis/calcification is present, without any  evidence of aortic stenosis.   Comparison(s): No prior Echocardiogram.    Patient Profile      89 y.o. female with paroxysmal atrial fibrillation, hypertension, CKD 3b by labs, osteoporosis, RBBB. Presented to the ED 12/3 from PCP office as she was there for follow-up from a fever and chest pain the week prior. She was being treated for suspected pneumonia and on Levaquin for 5 days. Due to AF RVR, sent to the ER.  Assessment & Plan    1. Atrial fibrillation with RVR  - Recurrence noted in the setting of right lower lobe community-acquired pneumonia, not on any rate controlling agents prior to admission (HR 80s in NSR in 06/2023 AF clinic visit) - Started on IV diltiazem  -> transitioned to oral diltiazem  receiving 300mg  daily on 12/6, 12/7, with RVR overnight prompting addition of diltiazem  drip and transition to oral diltiazem  120mg  TID; also received 25mg  of Lopressor  x3 yesterday then transition to 50mg  BID this morning - HR significantly improved this morning on a gradual basis over the span of an hour from 1teens to the 40s-50s - does not look like she acutely converted, still looks like AFib - EKG confirms still AF with RBBB, nonspecific STTW changes - will stop diltiazem  drip and review plan for meds with MD - update labs today  - given PNA, no plans for DCCV - check TSH - continue Eliquis  5mg  BID - dose appropriate but pending f/u renal function this AM  2. Community-acquired pneumonia - has been treated with Levaquin for total of 5 days by PCP, now with worsening symptoms on admission - repeat labs with procalcitonin as abx have been ordered - addendum: CBC with elevated WBC 19.7, relayed to primary team who plans to f/u CXR and pro-cal   3. Hypomagnesemia - Mg 1.6, supplemented and improved, rechecking today   4. HTN - home losartan  on hold to allow for HR controlling measures  5. Mild MR by echo - no intervention needed  For questions or updates, please contact [facility] Please consult [email or web address] for contact info under Cardiology/STEMI.  Signed, Treyshawn Muldrew  N Demeka Sutter, PA-C 12/24/2023, 10:37 AM

## 2023-12-25 DIAGNOSIS — I10 Essential (primary) hypertension: Secondary | ICD-10-CM

## 2023-12-25 LAB — BASIC METABOLIC PANEL WITH GFR
Anion gap: 9 (ref 5–15)
BUN: 17 mg/dL (ref 8–23)
CO2: 20 mmol/L — ABNORMAL LOW (ref 22–32)
Calcium: 8.3 mg/dL — ABNORMAL LOW (ref 8.9–10.3)
Chloride: 107 mmol/L (ref 98–111)
Creatinine, Ser: 1.02 mg/dL — ABNORMAL HIGH (ref 0.44–1.00)
GFR, Estimated: 50 mL/min — ABNORMAL LOW (ref >60)
Glucose, Bld: 118 mg/dL — ABNORMAL HIGH (ref 70–99)
Potassium: 4.1 mmol/L (ref 3.5–5.1)
Sodium: 136 mmol/L (ref 135–145)

## 2023-12-25 LAB — CBC
HCT: 35 % — ABNORMAL LOW (ref 36.0–46.0)
Hemoglobin: 11.1 g/dL — ABNORMAL LOW (ref 12.0–15.0)
MCH: 27 pg (ref 26.0–34.0)
MCHC: 31.7 g/dL (ref 30.0–36.0)
MCV: 85.2 fL (ref 80.0–100.0)
Platelets: 494 K/uL — ABNORMAL HIGH (ref 150–400)
RBC: 4.11 MIL/uL (ref 3.87–5.11)
RDW: 15 % (ref 11.5–15.5)
WBC: 14 K/uL — ABNORMAL HIGH (ref 4.0–10.5)
nRBC: 0 % (ref 0.0–0.2)

## 2023-12-25 LAB — MAGNESIUM: Magnesium: 2.1 mg/dL (ref 1.7–2.4)

## 2023-12-25 MED ORDER — DILTIAZEM HCL ER COATED BEADS 360 MG PO CP24: 360.0000 mg | ORAL_CAPSULE | Freq: Every day | ORAL | 2 refills | Status: DC

## 2023-12-25 MED ORDER — DILTIAZEM HCL ER COATED BEADS 180 MG PO CP24: 360.0000 mg | ORAL_CAPSULE | Freq: Every day | ORAL | Status: DC

## 2023-12-25 MED ORDER — FUROSEMIDE 10 MG/ML IJ SOLN
20.0000 mg | Freq: Once | INTRAMUSCULAR | Status: AC
  Administered 2023-12-25: 20 mg via INTRAVENOUS
  Filled 2023-12-25: qty 2

## 2023-12-25 MED ORDER — METOPROLOL TARTRATE 50 MG PO TABS: 50.0000 mg | ORAL_TABLET | Freq: Two times a day (BID) | ORAL | 0 refills | Status: DC

## 2023-12-25 MED ORDER — MAGNESIUM OXIDE -MG SUPPLEMENT 400 (240 MG) MG PO TABS: 400.0000 mg | ORAL_TABLET | Freq: Every day | ORAL | 0 refills | Status: DC

## 2023-12-25 NOTE — Care Management Important Message (Signed)
 Important Message  Patient Details  Name: Michaelle Mottern MRN: [id number] Date of Birth: 10-Jun-1924   Important Message Given:  Yes - Medicare IM     Vonzell Arrie Sharps 12/25/2023, 10:39 AM

## 2023-12-25 NOTE — Progress Notes (Signed)
 Report has been called [name], [facility] at [facility]. Patients daughter has also been provided the same information and verbalizes understanding.

## 2023-12-25 NOTE — TOC Transition Note (Addendum)
 Transition of Care [facility]) - Discharge Note   Patient Details  Name: [name] MRN: [id number] Date of Birth: October 03, 1924  [facility] CM/SW Contact:  [name], LCSWA Phone Number: 12/25/2023, 11:21 AM   Clinical Narrative:     Patient will DC to: [facility]  Anticipated DC date: 12/25/2023  Family notified: Deane  Transport by: Daughter Betsy  ?  Per MD patient ready for DC to [facility] . RN, patient, patient's family, and facility notified of DC. Discharge Summary sent to facility. RN given number for report [phone]. RM#40B. DC packet on chart.   CSW signing off.   Final next level of care: Skilled Nursing Facility Barriers to Discharge: No Barriers Identified   Patient Goals and CMS Choice Patient states their goals for this hospitalization and ongoing recovery are:: SNF CMS Medicare.gov Compare Post Acute Care list provided to:: Patient Choice offered to / list presented to : Patient      Discharge Placement              Patient chooses bed at: [facility] Patient to be transferred to facility by: daughter betsy Name of family member notified: Betsy Patient and family notified of of transfer: 12/25/23  Discharge Plan and Services Additional resources added to the After Visit Summary for                                       Social Drivers of Health (SDOH) Interventions SDOH Screenings   Food Insecurity: No Food Insecurity (12/19/2023)  Housing: Low Risk  (12/19/2023)  Transportation Needs: No Transportation Needs (12/19/2023)  Utilities: Not At Risk (12/19/2023)  Social Connections: Moderately Isolated (12/19/2023)  Tobacco Use: Low Risk  (12/20/2023)     Readmission Risk Interventions     No data to display

## 2023-12-25 NOTE — Progress Notes (Signed)
  Progress Note  Patient Name: [name] Date of Encounter: 12/25/2023 [facility] Cardiologist: Arun K Thukkani, MD   Interval Summary    No complaints this morning, hopeful to DC home soon.   Vital Signs Vitals:   12/24/23 2056 12/24/23 2358 12/25/23 0420 12/25/23 0817  BP: 135/76 (!) 116/58 128/70 (!) 125/56  Pulse: (!) 102 83 94 (!) 58  Resp: 19 20 (!) 21 (!) 21  Temp: 98.3 F (36.8 C) 98.5 F (36.9 C) 98.4 F (36.9 C) 98.4 F (36.9 C)  TempSrc: Oral Oral Oral Oral  SpO2: 95% 96% 96% 94%  Weight:      Height:       No intake or output data in the 24 hours ending 12/25/23 0830    12/19/2023   11:17 AM 07/03/2023   10:07 AM 04/16/2023    1:48 PM  Last 3 Weights  Weight (lbs) 180 lb 181 lb 9.6 oz 179 lb  Weight (kg) 81.647 kg 82.373 kg 81.194 kg      Telemetry/ECG  Atrial fibrillation, rates 70-110s (up with ambulation) - Personally Reviewed  Physical Exam  GEN: No acute distress.   Neck: No JVD Cardiac: Irreg Irreg, no murmurs, rubs, or gallops.  Respiratory: Diminished in bases GI: Soft, nontender, non-distended  MS: No edema  Assessment & Plan   89 y.o. female with paroxysmal atrial fibrillation, hypertension, CKD 3b by labs, osteoporosis, RBBB. Presented to the ED 12/3 from PCP office as she was there for follow-up from a fever and chest pain the week prior. She was being treated for suspected pneumonia and on Levaquin for 5 days. Due to AF RVR, sent to the ER.   Atrial fibrillation with RVR  -- noted in the setting of right lower lobe community-acquired pneumonia, not on any rate controlling agents prior to admission (HR 80s in NSR in 06/2023 AF clinic visit) -- initially started on IV diltiazem  -> transitioned to oral diltiazem  receiving 300mg  daily on 12/6, 12/7 -- developed recurrent RVR prompting addition of diltiazem  drip and transitioned to oral diltiazem  120mg  TID -- will plan to consolidate Dilt to 360mg  daily tomorrow, continue  metoprolol  50mg  BID  -- TSH 1.371 -- continue Eliquis  5mg  BID    Community-acquired pneumonia -- treated with Levaquin for total of 5 days by PCP -- procal neg, WBC down-trending -- CXR 12/8 with increasing left basilar opacity and small bilateral pleural effusions -- give IV lasix  20mg  x1 -- per primary   Hypomagnesemia -- resolved   HTN -- home losartan  on hold to allow for HR controlling measures   Mild MR by echo -- continued monitoring    For questions or updates, please contact [facility] Please consult [email or web address] for contact info under   Signed, Manuelita Rummer, NP

## 2023-12-25 NOTE — Progress Notes (Signed)
 Pt OOB to BR this morning. HR up to 138 while ambulating. Cardizem  120 mg given as scheduled. Pt asymptomatic

## 2023-12-25 NOTE — Discharge Summary (Signed)
 Physician Discharge Summary  [name] FMW:[id number] DOB: 05/06/1924 DOA: 12/19/2023  PCP: Shayne Anes, MD  Admit date: 12/19/2023 Discharge date: 12/25/2023  Admitted From: Independent living Disposition: Skilled nursing facility  Recommendations for Outpatient Follow-up:  Follow up with PCP in 1-2 weeks Please obtain BMP/CBC in one week Cardiology will schedule follow-up  Home Health: N/A Equipment/Devices: N/A  Discharge Condition: Stable CODE STATUS: Full code Diet recommendation: Regular diet  Discharge summary: 89 year old from independent living at friend's home, history of A-fib, hypertension and osteoporosis presented to the ER with rapid A-fib.  Recently diagnosed with right sided pneumonia about 10 days ago and treated with Levaquin for 5 days.  Fever improved.  Seen at PCP office and found to have A-fib with RVR with heart rate anywhere from 120-180 and sent to the ER.  Received 20 mg of IV Cardizem  on the way to the ER.  In the emergency room electrolytes adequate.  Magnesium  1.6.  Troponins negative.  Lactic acid 1.9.  WBC 14.  Respiratory virus panel negative.  Chest x-ray with cardiomegaly, bibasilar atelectasis and right-sided basilar atelectasis.  Started on IV Rocephin  and azithromycin , IV Cardizem  infusion and admitted to the hospital. Remained in the hospital with fluctuating heart rate, occasionally RVR.  Followed by cardiology. Pneumonia improved, symptomatically improved however has persistently elevated WBC count. Conservative management advised.  Going to the SNF today.   # A-fib with RVR: Paroxysmal A-fib. Initially treated with Cardizem  infusion.  Echo with normal EF. Heart rate fluctuates.  Occasionally very high and then low.  Decided conservative management. Patient will be going home on Cardizem  CD 360 mg daily, metoprolol  50 mg twice daily. Occasional elevated heart rate if asymptomatic, will not treat aggressively. Tolerating Eliquis  well but we  will continue. Cardiology to schedule follow-up.  # Right lower lobe pneumonia: Likely atelectasis or remaining infection.  She was treated with 5 days of Levaquin.  Azithromycin  x 3 days Rocephin  x 5 days.  Completed antibiotics.  Symptomatically improved.  Continue to encourage chest physiotherapy and incentive spirometry.  Sputum cultures and blood cultures negative.  Chest x-ray 12/8, with atelectasis on the left lower lobe.  Patient has no fever, cough.  No respiratory symptoms.  WC count 14,000.  Procalcitonin less than 0.1. This is likely atelectasis, she has completed 3 antibiotic already.  Will suggest incentive spirometry and mobility.  Will not do further antibiotics.   Hypomagnesemia: Replaced.  Will keep on oral replacement.   Hypertension: Losartan  will be discontinued to make room for increasing dose of Cardizem  and metoprolol .    Medically stable to transition to a skilled level of care.   Discharge Diagnoses:  Principal Problem:   Atrial fibrillation with rapid ventricular response [facility])    Discharge Instructions  Discharge Instructions     Diet general   Complete by: As directed    Increase activity slowly   Complete by: As directed       Allergies as of 12/25/2023       Reactions   Estrogens Other (See Comments)   Ezetimibe-simvastatin    Other Reaction(s): pain in liver region   Mite (d. Farinae) Other (See Comments)   Simvastatin    Other Reaction(s): cramps in legs.        Medication List     STOP taking these medications    levofloxacin 500 MG tablet Commonly known as: LEVAQUIN   losartan  50 MG tablet Commonly known as: COZAAR    predniSONE 10 MG tablet Commonly known as:  DELTASONE       TAKE these medications    atorvastatin  10 MG tablet Commonly known as: LIPITOR Take 10 mg by mouth at bedtime.   Cholecalciferol 25 MCG (1000 UT) capsule Take 1,000 Units by mouth daily.   Claritin 10 MG Caps Generic drug:  Loratadine Take 10 mg by mouth daily as needed (allergies).   diltiazem  360 MG 24 hr capsule Commonly known as: CARDIZEM  CD Take 1 capsule (360 mg total) by mouth daily. Start taking on: December 26, 2023   Eliquis  5 MG Tabs tablet Generic drug: apixaban  TAKE 1 TABLET BY MOUTH TWICE A DAY   fluticasone 50 MCG/ACT nasal spray Commonly known as: FLONASE Place 1 spray into both nostrils as needed for allergies or rhinitis.   magnesium  oxide 400 (240 Mg) MG tablet Commonly known as: MAG-OX Take 1 tablet (400 mg total) by mouth daily. Start taking on: December 26, 2023   metoprolol  tartrate 50 MG tablet Commonly known as: LOPRESSOR  Take 1 tablet (50 mg total) by mouth 2 (two) times daily.   Prolia  60 MG/ML Sosy injection Generic drug: denosumab  Inject 60 mg into the skin every 6 (six) months.   Tylenol 8 Hour Arthritis Pain 650 MG CR tablet Generic drug: acetaminophen Take 650 mg by mouth every 8 (eight) hours as needed for pain.        Allergies  Allergen Reactions   Estrogens Other (See Comments)   Ezetimibe-Simvastatin     Other Reaction(s): pain in liver region   Mite (D. Farinae) Other (See Comments)   Simvastatin     Other Reaction(s): cramps in legs.    Consultations: Cardiology   Procedures/Studies: DG Chest 2 View Result Date: 12/24/2023 EXAM: 2 VIEW(S) XRAY OF THE CHEST 12/24/2023 03:33:00 PM COMPARISON: 12/19/2023 CLINICAL HISTORY: Pneumonia FINDINGS: LUNGS AND PLEURA: Small bilateral pleural effusions. Increasing left basilar opacity. Biapical pleural and pulmonary scarring. No pneumothorax. HEART AND MEDIASTINUM: Stable cardiomegaly. Aortic atherosclerosis. No acute abnormality of the mediastinal silhouette. BONES AND SOFT TISSUES: No acute osseous abnormality. IMPRESSION: 1. Increasing left basilar opacity, suspicious for pneumonia. 2. Small bilateral pleural effusions. Electronically signed by: Morgane Naveau MD 12/24/2023 07:19 PM EST RP Workstation:  [id number]   ECHOCARDIOGRAM COMPLETE Result Date: 12/20/2023    ECHOCARDIOGRAM REPORT   Patient Name:   [name] Date of Exam: 12/20/2023 Medical Rec #:  [id number]     Height:       63.0 in Accession #:    [id number]    Weight:       180.0 lb Date of Birth:  [birth year]      BSA:          1.849 m Patient Age:    89 years      BP:           107/60 mmHg Patient Gender: F             HR:           92 bpm. Exam Location:  Inpatient Procedure: 2D Echo, Cardiac Doppler and Color Doppler (Both Spectral and Color            Flow Doppler were utilized during procedure). Indications:    Atrial Fibrillation  History:        Patient has no prior history of Echocardiogram examinations.                 Arrythmias:Atrial Fibrillation; Risk Factors:Hypertension and  Dyslipidemia.  Sonographer:    Juliene Rucks Referring Phys: [phone] LINDSAY B ROBERTS IMPRESSIONS  1. Left ventricular ejection fraction, by estimation, is 55 to 60%. The left ventricle has normal function. Left ventricular endocardial border not optimally defined to evaluate regional wall motion. Left ventricular diastolic function could not be evaluated.  2. Right ventricular systolic function is mildly reduced. The right ventricular size is normal. There is mildly elevated pulmonary artery systolic pressure.  3. Left atrial size was severely dilated.  4. The mitral valve is normal in structure. Mild mitral valve regurgitation.  5. The aortic valve is calcified. Aortic valve regurgitation is not visualized. Aortic valve sclerosis/calcification is present, without any evidence of aortic stenosis. Comparison(s): No prior Echocardiogram. FINDINGS  Left Ventricle: Left ventricular ejection fraction, by estimation, is 55 to 60%. The left ventricle has normal function. Left ventricular endocardial border not optimally defined to evaluate regional wall motion. The left ventricular internal cavity size was normal in size. There is no left ventricular  hypertrophy. Left ventricular diastolic function could not be evaluated due to atrial fibrillation. Left ventricular diastolic function could not be evaluated. Right Ventricle: The right ventricular size is normal. Right vetricular wall thickness was not well visualized. Right ventricular systolic function is mildly reduced. There is mildly elevated pulmonary artery systolic pressure. The tricuspid regurgitant velocity is 2.88 m/s, and with an assumed right atrial pressure of 3 mmHg, the estimated right ventricular systolic pressure is 36.2 mmHg. Left Atrium: Left atrial size was severely dilated. Right Atrium: Right atrial size was not well visualized. Pericardium: The pericardium was not well visualized. Mitral Valve: The mitral valve is normal in structure. Mild mitral valve regurgitation. Tricuspid Valve: The tricuspid valve is normal in structure. Tricuspid valve regurgitation is mild. Aortic Valve: The aortic valve is calcified. Aortic valve regurgitation is not visualized. Aortic valve sclerosis/calcification is present, without any evidence of aortic stenosis. Pulmonic Valve: The pulmonic valve was not well visualized. Pulmonic valve regurgitation is not visualized. Aorta: The aortic root is normal in size and structure. Venous: The inferior vena cava was not well visualized. IAS/Shunts: The interatrial septum was not well visualized.  LEFT VENTRICLE PLAX 2D LVIDd:         5.00 cm      Diastology LVIDs:         3.30 cm      LV e' medial:  5.51 cm/s LV PW:         1.00 cm      LV e' lateral: 3.65 cm/s LV IVS:        0.90 cm LVOT diam:     1.80 cm LVOT Area:     2.54 cm  LV Volumes (MOD) LV vol d, MOD A4C: 116.0 ml LV vol s, MOD A4C: 52.1 ml LV SV MOD A4C:     116.0 ml RIGHT VENTRICLE            IVC RV Basal diam:  3.10 cm    IVC diam: 1.90 cm RV Mid diam:    2.60 cm RV S prime:     5.59 cm/s TAPSE (M-mode): 0.9 cm LEFT ATRIUM            Index        RIGHT ATRIUM           Index LA diam:      3.30 cm  1.78  cm/m   RA Area:     15.40 cm LA Vol (A4C): 123.0 ml 66.52  ml/m  RA Volume:   37.80 ml  20.44 ml/m   AORTA Ao Root diam: 2.70 cm TRICUSPID VALVE TR Peak grad:   33.2 mmHg TR Vmax:        288.00 cm/s  SHUNTS Systemic Diam: 1.80 cm Joelle Cedars Tonleu Electronically signed by Joelle Cedars Tonleu Signature Date/Time: 12/20/2023/1:17:54 PM    Final    DG Chest Port 1 View Result Date: 12/19/2023 CLINICAL DATA:  Shortness of breath, fever and chest pain, atrial fibrillation EXAM: PORTABLE CHEST 1 VIEW COMPARISON:  04/05/2018 FINDINGS: Mild cardiomegaly and slight ill-defined hazy basilar opacities, favored to be atelectasis. Difficult to exclude early basilar pneumonia. Suspect trace right effusion. Aorta atherosclerotic and tortuous. Degenerative changes of the spine. Bones are osteopenic. Similar rightward tracheal deviation. No acute osseous finding. IMPRESSION: 1. Cardiomegaly with basilar atelectasis versus early pneumonia. 2. Trace right effusion. Electronically Signed   By: CHRISTELLA.  Shick M.D.   On: 12/19/2023 12:04   (Echo, Carotid, EGD, Colonoscopy, ERCP)    Subjective: Patient seen and examined.  She denies any complaints.  Heart rate is mostly less than 100 and occasionally 110.  Patient without any chest pain shortness of breath fever or palpitations.  She was happy to hear that she is going to friend's home today.   Discharge Exam: Vitals:   12/25/23 0420 12/25/23 0817  BP: 128/70 (!) 125/56  Pulse: 94 (!) 58  Resp: (!) 21 (!) 21  Temp: 98.4 F (36.9 C) 98.4 F (36.9 C)  SpO2: 96% 94%   Vitals:   12/24/23 2056 12/24/23 2358 12/25/23 0420 12/25/23 0817  BP: 135/76 (!) 116/58 128/70 (!) 125/56  Pulse: (!) 102 83 94 (!) 58  Resp: 19 20 (!) 21 (!) 21  Temp: 98.3 F (36.8 C) 98.5 F (36.9 C) 98.4 F (36.9 C) 98.4 F (36.9 C)  TempSrc: Oral Oral Oral Oral  SpO2: 95% 96% 96% 94%  Weight:      Height:        General: Pt is alert, awake, not in acute distress.  Pleasant  interactive. Cardiovascular: Rate irregularly irregular , S1/S2 +, no rubs, no gallops Respiratory: CTA bilaterally, no wheezing, no rhonchi Abdominal: Soft, NT, ND, bowel sounds + Extremities: no edema, no cyanosis    The results of significant diagnostics from this hospitalization (including imaging, microbiology, ancillary and laboratory) are listed below for reference.     Microbiology: Recent Results (from the past 240 hours)  Culture, blood (routine x 2)     Status: None   Collection Time: 12/19/23 11:14 AM   Specimen: BLOOD  Result Value Ref Range Status   Specimen Description BLOOD RIGHT ANTECUBITAL  Final   Special Requests   Final    BOTTLES DRAWN AEROBIC AND ANAEROBIC Blood Culture adequate volume   Culture   Final    NO GROWTH 5 DAYS Performed at [facility], [address], [place], [place] [address]    Report Status 12/24/2023 FINAL  Final  Resp panel by RT-PCR (RSV, Flu A&B, Covid) Anterior Nasal Swab     Status: None   Collection Time: 12/19/23 11:42 AM   Specimen: Anterior Nasal Swab  Result Value Ref Range Status   SARS Coronavirus 2 by RT PCR NEGATIVE NEGATIVE Final   Influenza A by PCR NEGATIVE NEGATIVE Final   Influenza B by PCR NEGATIVE NEGATIVE Final    Comment: (NOTE) The Xpert Xpress SARS-CoV-2/FLU/RSV plus assay is intended as an aid in the diagnosis of influenza from  Nasopharyngeal swab specimens and should not be used as a sole basis for treatment. Nasal washings and aspirates are unacceptable for Xpert Xpress SARS-CoV-2/FLU/RSV testing.  Fact Sheet for Patients: bloggercourse.com  Fact Sheet for Healthcare Providers: seriousbroker.it  This test is not yet approved or cleared by the [place]  FDA and has been authorized for detection and/or diagnosis of SARS-CoV-2 by FDA under an Emergency Use Authorization (EUA). This EUA will remain in effect (meaning this test can be used) for  the duration of the COVID-19 declaration under Section 564(b)(1) of the Act, 21 U.S.C. section 360bbb-3(b)(1), unless the authorization is terminated or revoked.     Resp Syncytial Virus by PCR NEGATIVE NEGATIVE Final    Comment: (NOTE) Fact Sheet for Patients: bloggercourse.com  Fact Sheet for Healthcare Providers: seriousbroker.it  This test is not yet approved or cleared by the [place]  FDA and has been authorized for detection and/or diagnosis of SARS-CoV-2 by FDA under an Emergency Use Authorization (EUA). This EUA will remain in effect (meaning this test can be used) for the duration of the COVID-19 declaration under Section 564(b)(1) of the Act, 21 U.S.C. section 360bbb-3(b)(1), unless the authorization is terminated or revoked.  Performed at [facility], [address], [place], [place] [address]   Culture, blood (routine x 2)     Status: None   Collection Time: 12/19/23 11:57 AM   Specimen: BLOOD LEFT HAND  Result Value Ref Range Status   Specimen Description BLOOD LEFT HAND  Final   Special Requests   Final    BOTTLES DRAWN AEROBIC AND ANAEROBIC Blood Culture results may not be optimal due to an inadequate volume of blood received in culture bottles   Culture   Final    NO GROWTH 5 DAYS Performed at [facility], [address], [place], [place] [address]    Report Status 12/24/2023 FINAL  Final     Labs: BNP (last 3 results) Recent Labs    12/19/23 1142  BNP 305.2*   Basic Metabolic Panel: Recent Labs  Lab 12/19/23 1142 12/19/23 1155 12/20/23 0817 12/21/23 0357 12/24/23 1121 12/25/23 0247  NA 139 140 141 140 135 136  K 4.5 4.4 4.4 4.1 4.3 4.1  CL 103 103 107 104 106 107  CO2 25  --  23 25 23  20*  GLUCOSE 173* 168* 131* 139* 180* 118*  BUN 25* 28* 27* 25* 15 17  CREATININE 1.31* 1.30* 1.24* 1.18* 1.00 1.02*  CALCIUM  9.7  --  9.2 8.9 8.3* 8.3*  MG 1.6*  --  2.2 2.0 2.1 2.1    Liver Function Tests: Recent Labs  Lab 12/19/23 1142 12/21/23 0357  AST 48* 27  ALT 36 24  ALKPHOS 62 61  BILITOT 1.0 1.0  PROT 5.8* 5.7*  ALBUMIN 2.8* 2.6*   No results for input(s): LIPASE, AMYLASE in the last 168 hours. No results for input(s): AMMONIA in the last 168 hours. CBC: Recent Labs  Lab 12/19/23 1142 12/19/23 1155 12/21/23 0357 12/24/23 1121 12/25/23 0247  WBC 14.1*  --  12.5* 19.7* 14.0*  NEUTROABS 12.4*  --  8.8*  --   --   HGB 11.6* 12.9 11.2* 12.9 11.1*  HCT 36.9 38.0 36.0 40.2 35.0*  MCV 86.4  --  85.7 84.8 85.2  PLT 808*  --  676* 584* 494*   Cardiac Enzymes: No results for input(s): CKTOTAL, CKMB, CKMBINDEX, TROPONINI in the last 168 hours. BNP: Invalid input(s): POCBNP CBG: No results for  input(s): GLUCAP in the last 168 hours. D-Dimer No results for input(s): DDIMER in the last 72 hours. Hgb A1c No results for input(s): HGBA1C in the last 72 hours. Lipid Profile No results for input(s): CHOL, HDL, LDLCALC, TRIG, CHOLHDL, LDLDIRECT in the last 72 hours. Thyroid  function studies Recent Labs    12/24/23 1121  TSH 1.371   Anemia work up No results for input(s): VITAMINB12, FOLATE, FERRITIN, TIBC, IRON, RETICCTPCT in the last 72 hours. Urinalysis    Component Value Date/Time   COLORURINE YELLOW 12/19/2023 1500   APPEARANCEUR CLOUDY (A) 12/19/2023 1500   LABSPEC 1.013 12/19/2023 1500   PHURINE 6.0 12/19/2023 1500   GLUCOSEU NEGATIVE 12/19/2023 1500   HGBUR LARGE (A) 12/19/2023 1500   HGBUR negative 10/29/2008 0000   BILIRUBINUR NEGATIVE 12/19/2023 1500   KETONESUR NEGATIVE 12/19/2023 1500   PROTEINUR NEGATIVE 12/19/2023 1500   UROBILINOGEN 0.2 10/29/2008 0000   NITRITE NEGATIVE 12/19/2023 1500   LEUKOCYTESUR LARGE (A) 12/19/2023 1500   Sepsis Labs Recent Labs  Lab 12/19/23 1142 12/21/23 0357 12/24/23 1121 12/25/23 0247  WBC 14.1* 12.5* 19.7* 14.0*   Microbiology Recent Results  (from the past 240 hours)  Culture, blood (routine x 2)     Status: None   Collection Time: 12/19/23 11:14 AM   Specimen: BLOOD  Result Value Ref Range Status   Specimen Description BLOOD RIGHT ANTECUBITAL  Final   Special Requests   Final    BOTTLES DRAWN AEROBIC AND ANAEROBIC Blood Culture adequate volume   Culture   Final    NO GROWTH 5 DAYS Performed at [facility], [address] [place], [place] [address]    Report Status 12/24/2023 FINAL  Final  Resp panel by RT-PCR (RSV, Flu A&B, Covid) Anterior Nasal Swab     Status: None   Collection Time: 12/19/23 11:42 AM   Specimen: Anterior Nasal Swab  Result Value Ref Range Status   SARS Coronavirus 2 by RT PCR NEGATIVE NEGATIVE Final   Influenza A by PCR NEGATIVE NEGATIVE Final   Influenza B by PCR NEGATIVE NEGATIVE Final    Comment: (NOTE) The Xpert Xpress SARS-CoV-2/FLU/RSV plus assay is intended as an aid in the diagnosis of influenza from Nasopharyngeal swab specimens and should not be used as a sole basis for treatment. Nasal washings and aspirates are unacceptable for Xpert Xpress SARS-CoV-2/FLU/RSV testing.  Fact Sheet for Patients: bloggercourse.com  Fact Sheet for Healthcare Providers: seriousbroker.it  This test is not yet approved or cleared by the [place]  FDA and has been authorized for detection and/or diagnosis of SARS-CoV-2 by FDA under an Emergency Use Authorization (EUA). This EUA will remain in effect (meaning this test can be used) for the duration of the COVID-19 declaration under Section 564(b)(1) of the Act, 21 U.S.C. section 360bbb-3(b)(1), unless the authorization is terminated or revoked.     Resp Syncytial Virus by PCR NEGATIVE NEGATIVE Final    Comment: (NOTE) Fact Sheet for Patients: bloggercourse.com  Fact Sheet for Healthcare Providers: seriousbroker.it  This test is not yet  approved or cleared by the [place]  FDA and has been authorized for detection and/or diagnosis of SARS-CoV-2 by FDA under an Emergency Use Authorization (EUA). This EUA will remain in effect (meaning this test can be used) for the duration of the COVID-19 declaration under Section 564(b)(1) of the Act, 21 U.S.C. section 360bbb-3(b)(1), unless the authorization is terminated or revoked.  Performed at [facility], [address] [place], [place] [address]  Culture, blood (routine x 2)     Status: None   Collection Time: 12/19/23 11:57 AM   Specimen: BLOOD LEFT HAND  Result Value Ref Range Status   Specimen Description BLOOD LEFT HAND  Final   Special Requests   Final    BOTTLES DRAWN AEROBIC AND ANAEROBIC Blood Culture results may not be optimal due to an inadequate volume of blood received in culture bottles   Culture   Final    NO GROWTH 5 DAYS Performed at [facility], [address], [place], [place] [address]    Report Status 12/24/2023 FINAL  Final     Time coordinating discharge: 35 minutes  SIGNED:   Renato Applebaum, MD  [facility] 12/25/2023, 11:07 AM

## 2023-12-26 ENCOUNTER — Non-Acute Institutional Stay (SKILLED_NURSING_FACILITY): Admitting: Family Medicine

## 2023-12-26 DIAGNOSIS — D6869 Other thrombophilia: Secondary | ICD-10-CM

## 2023-12-26 DIAGNOSIS — M810 Age-related osteoporosis without current pathological fracture: Secondary | ICD-10-CM | POA: Diagnosis not present

## 2023-12-26 DIAGNOSIS — I10 Essential (primary) hypertension: Secondary | ICD-10-CM | POA: Diagnosis not present

## 2023-12-26 DIAGNOSIS — I480 Paroxysmal atrial fibrillation: Secondary | ICD-10-CM | POA: Diagnosis not present

## 2023-12-26 NOTE — Assessment & Plan Note (Signed)
 Currently on calcium  channel blocker and beta-blocker as well as Eliquis .  No aggressive plans are contemplated at this time.  Rate does vary patient is asymptomatic even with higher rate

## 2023-12-26 NOTE — Progress Notes (Signed)
 Provider:  Garnette Pinal, MD Location:      Place of Service:     PCP: Shayne Anes, MD Patient Care Team: Shayne Anes, MD as PCP - General (Internal Medicine) Thukkani, Arun K, MD as PCP - Cardiology (Cardiology)  Extended Emergency Contact Information Primary Emergency Contact: [name],betsy Home Phone: [phone] Mobile Phone: [phone] Relation: Daughter  Code Status:  Goals of Care: Advanced Directive information    12/19/2023    5:00 PM  Advanced Directives  Does Patient Have a Medical Advance Directive? Yes  Type of Advance Directive Healthcare Power of Attorney  Does patient want to make changes to medical advance directive? No - Patient declined  Copy of Healthcare Power of Attorney in Chart? No - copy requested         HPI: Patient is a 89 [name] seen today for admission to [facility] facility after hospitalization from 12/19/2023 until 12/25/2023 with chief problem of atrial fibrillation with rapid ventricular response.  Prior to that hospitalization she had been diagnosed with right sided pneumonia and treated with a 5-day course of Levaquin.  She was subsequently seen at her PCP office and found to have A-fib heart rate 120s to 180 and was sent to the emergency room.  She received 20 mg of IV Cardizem  and route to the emergency room.  She was treated for pneumonia although chest x-ray did not confirm.  While in the hospital she remained in A-fib with rapid response and was followed by cardiology.  She was sent home on Cardizem  CD 360 as well as metoprolol  50 mg twice a day.  She is also on Eliquis  which she seems to be tolerating and had been on Eliquis  prior to hospitalization. Generally she is very healthy 89 year old with hypertension osteoporosis and chronic A-fib.  [facility] in an apartment but has found increasing difficulty getting to the dining room which is some distance from  her apartment.   Past Medical History:  Diagnosis Date   Hypertension    Osteoporosis    No past surgical history on file.  reports that she has never smoked. She has never used smokeless tobacco. She reports that she does not currently use alcohol . She reports that she does not use drugs. Social History   Socioeconomic History   Marital status: Widowed    Spouse name: Not on file   Number of children: Not on file   Years of education: Not on file   Highest education level: Not on file  Occupational History   Not on file  Tobacco Use   Smoking status: Never   Smokeless tobacco: Never   Tobacco comments:    Never smoked 04/16/23  Vaping Use   Vaping status: Never Used  Substance and Sexual Activity   Alcohol  use: Not Currently   Drug use: Never   Sexual activity: Not on file  Other Topics Concern   Not on file  Social History Narrative   Not on file   Social Drivers of Health   Financial Resource Strain: Not on file  Food Insecurity: No Food Insecurity (12/19/2023)   Hunger Vital Sign    Worried About Running Out of Food in the Last Year: Never true    Ran Out of Food in the Last Year: Never true  Transportation Needs: No Transportation Needs (12/19/2023)   PRAPARE - Administrator, Civil Service (Medical): No    Lack of Transportation (  Non-Medical): No  Physical Activity: Not on file  Stress: Not on file  Social Connections: Moderately Isolated (12/19/2023)   Social Connection and Isolation Panel    Frequency of Communication with Friends and Family: More than three times a week    Frequency of Social Gatherings with Friends and Family: More than three times a week    Attends Religious Services: More than 4 times per year    Active Member of [employer] or Organizations: No    Attends Banker Meetings: Never    Marital Status: Widowed  Intimate Partner Violence: Not At Risk (12/19/2023)   Humiliation, Afraid, Rape, and Kick questionnaire     Fear of Current or Ex-Partner: No    Emotionally Abused: No    Physically Abused: No    Sexually Abused: No    Functional Status Survey:    Family History  Problem Relation Age of Onset   Hypertension Father     Health Maintenance  Topic Date Due   Zoster Vaccines- Shingrix (1 of 2) 08/19/1976   Bone Density Scan  Never done   DTaP/Tdap/Td (2 - Tdap) 01/17/2016   COVID-19 Vaccine (1 - 2025-26 season) Never done   Medicare Annual Wellness (AWV)  03/13/2024   Pneumococcal Vaccine: 50+ Years  Completed   Influenza Vaccine  Completed   Meningococcal B Vaccine  Aged Out    Allergies  Allergen Reactions   Estrogens Other (See Comments)   Ezetimibe-Simvastatin     Other Reaction(s): pain in liver region   Mite (D. Farinae) Other (See Comments)   Simvastatin     Other Reaction(s): cramps in legs.    Outpatient Encounter Medications as of 12/26/2023  Medication Sig   acetaminophen (TYLENOL 8 HOUR ARTHRITIS PAIN) 650 MG CR tablet Take 650 mg by mouth every 8 (eight) hours as needed for pain.   apixaban  (ELIQUIS ) 5 MG TABS tablet TAKE 1 TABLET BY MOUTH TWICE A DAY   atorvastatin  (LIPITOR) 10 MG tablet Take 10 mg by mouth at bedtime.   Cholecalciferol 25 MCG (1000 UT) capsule Take 1,000 Units by mouth daily.   denosumab  (PROLIA ) 60 MG/ML SOSY injection Inject 60 mg into the skin every 6 (six) months.   diltiazem  (CARDIZEM  CD) 360 MG 24 hr capsule Take 1 capsule (360 mg total) by mouth daily.   fluticasone (FLONASE) 50 MCG/ACT nasal spray Place 1 spray into both nostrils as needed for allergies or rhinitis.   Loratadine (CLARITIN) 10 MG CAPS Take 10 mg by mouth daily as needed (allergies).   magnesium  oxide (MAG-OX) 400 (240 Mg) MG tablet Take 1 tablet (400 mg total) by mouth daily.   metoprolol  tartrate (LOPRESSOR ) 50 MG tablet Take 1 tablet (50 mg total) by mouth 2 (two) times daily.   No facility-administered encounter medications on file as of 12/26/2023.    Review of  Systems  Constitutional:  Positive for fatigue.  HENT: Negative.    Respiratory: Negative.    Cardiovascular: Negative.   Gastrointestinal: Negative.   Genitourinary: Negative.   Musculoskeletal: Negative.   Skin: Negative.   Neurological:  Positive for weakness.  Hematological: Negative.   Psychiatric/Behavioral: Negative.      There were no vitals filed for this visit. There is no height or weight on file to calculate BMI. Physical Exam Vitals and nursing note reviewed.  Constitutional:      Appearance: Normal appearance.  HENT:     Nose: Nose normal.     Mouth/Throat:  Mouth: Mucous membranes are moist.     Pharynx: Oropharynx is clear.  Cardiovascular:     Rate and Rhythm: Tachycardia present. Rhythm irregular.     Pulses: Normal pulses.  Pulmonary:     Effort: Pulmonary effort is normal.     Breath sounds: Normal breath sounds.  Abdominal:     General: Bowel sounds are normal.     Palpations: Abdomen is soft.  Musculoskeletal:        General: Normal range of motion.  Skin:    General: Skin is warm.  Neurological:     General: No focal deficit present.     Mental Status: She is alert and oriented to person, place, and time.  Psychiatric:        Mood and Affect: Mood normal.        Behavior: Behavior normal.     Labs reviewed: Basic Metabolic Panel: Recent Labs    12/21/23 0357 12/24/23 1121 12/25/23 0247  NA 140 135 136  K 4.1 4.3 4.1  CL 104 106 107  CO2 25 23 20*  GLUCOSE 139* 180* 118*  BUN 25* 15 17  CREATININE 1.18* 1.00 1.02*  CALCIUM  8.9 8.3* 8.3*  MG 2.0 2.1 2.1   Liver Function Tests: Recent Labs    12/19/23 1142 12/21/23 0357  AST 48* 27  ALT 36 24  ALKPHOS 62 61  BILITOT 1.0 1.0  PROT 5.8* 5.7*  ALBUMIN 2.8* 2.6*   No results for input(s): LIPASE, AMYLASE in the last 8760 hours. No results for input(s): AMMONIA in the last 8760 hours. CBC: Recent Labs    12/19/23 1142 12/19/23 1155 12/21/23 0357 12/24/23 1121  12/25/23 0247  WBC 14.1*  --  12.5* 19.7* 14.0*  NEUTROABS 12.4*  --  8.8*  --   --   HGB 11.6*   < > 11.2* 12.9 11.1*  HCT 36.9   < > 36.0 40.2 35.0*  MCV 86.4  --  85.7 84.8 85.2  PLT 808*  --  676* 584* 494*   < > = values in this interval not displayed.   Cardiac Enzymes: No results for input(s): CKTOTAL, CKMB, CKMBINDEX, TROPONINI in the last 8760 hours. BNP: Invalid input(s): POCBNP No results found for: HGBA1C Lab Results  Component Value Date   TSH 1.371 12/24/2023   No results found for: VITAMINB12 No results found for: FOLATE No results found for: IRON, TIBC, FERRITIN  Imaging and Procedures obtained prior to SNF admission: ECHOCARDIOGRAM COMPLETE Result Date: 12/20/2023    ECHOCARDIOGRAM REPORT   Patient Name:   [name] Date of Exam: 12/20/2023 Medical Rec #:  [id number]     Height:       63.0 in Accession #:    [id number]    Weight:       180.0 lb Date of Birth:  [birth year]      BSA:          1.849 m Patient Age:    89 years      BP:           107/60 mmHg Patient Gender: F             HR:           92 bpm. Exam Location:  Inpatient Procedure: 2D Echo, Cardiac Doppler and Color Doppler (Both Spectral and Color            Flow Doppler were utilized during procedure). Indications:    Atrial Fibrillation  History:  Patient has no prior history of Echocardiogram examinations.                 Arrythmias:Atrial Fibrillation; Risk Factors:Hypertension and                 Dyslipidemia.  Sonographer:    Juliene Rucks Referring Phys: [phone] LINDSAY B ROBERTS IMPRESSIONS  1. Left ventricular ejection fraction, by estimation, is 55 to 60%. The left ventricle has normal function. Left ventricular endocardial border not optimally defined to evaluate regional wall motion. Left ventricular diastolic function could not be evaluated.  2. Right ventricular systolic function is mildly reduced. The right ventricular size is normal. There is mildly elevated pulmonary artery  systolic pressure.  3. Left atrial size was severely dilated.  4. The mitral valve is normal in structure. Mild mitral valve regurgitation.  5. The aortic valve is calcified. Aortic valve regurgitation is not visualized. Aortic valve sclerosis/calcification is present, without any evidence of aortic stenosis. Comparison(s): No prior Echocardiogram. FINDINGS  Left Ventricle: Left ventricular ejection fraction, by estimation, is 55 to 60%. The left ventricle has normal function. Left ventricular endocardial border not optimally defined to evaluate regional wall motion. The left ventricular internal cavity size was normal in size. There is no left ventricular hypertrophy. Left ventricular diastolic function could not be evaluated due to atrial fibrillation. Left ventricular diastolic function could not be evaluated. Right Ventricle: The right ventricular size is normal. Right vetricular wall thickness was not well visualized. Right ventricular systolic function is mildly reduced. There is mildly elevated pulmonary artery systolic pressure. The tricuspid regurgitant velocity is 2.88 m/s, and with an assumed right atrial pressure of 3 mmHg, the estimated right ventricular systolic pressure is 36.2 mmHg. Left Atrium: Left atrial size was severely dilated. Right Atrium: Right atrial size was not well visualized. Pericardium: The pericardium was not well visualized. Mitral Valve: The mitral valve is normal in structure. Mild mitral valve regurgitation. Tricuspid Valve: The tricuspid valve is normal in structure. Tricuspid valve regurgitation is mild. Aortic Valve: The aortic valve is calcified. Aortic valve regurgitation is not visualized. Aortic valve sclerosis/calcification is present, without any evidence of aortic stenosis. Pulmonic Valve: The pulmonic valve was not well visualized. Pulmonic valve regurgitation is not visualized. Aorta: The aortic root is normal in size and structure. Venous: The inferior vena cava was  not well visualized. IAS/Shunts: The interatrial septum was not well visualized.  LEFT VENTRICLE PLAX 2D LVIDd:         5.00 cm      Diastology LVIDs:         3.30 cm      LV e' medial:  5.51 cm/s LV PW:         1.00 cm      LV e' lateral: 3.65 cm/s LV IVS:        0.90 cm LVOT diam:     1.80 cm LVOT Area:     2.54 cm  LV Volumes (MOD) LV vol d, MOD A4C: 116.0 ml LV vol s, MOD A4C: 52.1 ml LV SV MOD A4C:     116.0 ml RIGHT VENTRICLE            IVC RV Basal diam:  3.10 cm    IVC diam: 1.90 cm RV Mid diam:    2.60 cm RV S prime:     5.59 cm/s TAPSE (M-mode): 0.9 cm LEFT ATRIUM            Index  RIGHT ATRIUM           Index LA diam:      3.30 cm  1.78 cm/m   RA Area:     15.40 cm LA Vol (A4C): 123.0 ml 66.52 ml/m  RA Volume:   37.80 ml  20.44 ml/m   AORTA Ao Root diam: 2.70 cm TRICUSPID VALVE TR Peak grad:   33.2 mmHg TR Vmax:        288.00 cm/s  SHUNTS Systemic Diam: 1.80 cm Joelle Cedars Tonleu Electronically signed by Joelle Cedars Tonleu Signature Date/Time: 12/20/2023/1:17:54 PM    Final    DG Chest Port 1 View Result Date: 12/19/2023 CLINICAL DATA:  Shortness of breath, fever and chest pain, atrial fibrillation EXAM: PORTABLE CHEST 1 VIEW COMPARISON:  04/05/2018 FINDINGS: Mild cardiomegaly and slight ill-defined hazy basilar opacities, favored to be atelectasis. Difficult to exclude early basilar pneumonia. Suspect trace right effusion. Aorta atherosclerotic and tortuous. Degenerative changes of the spine. Bones are osteopenic. Similar rightward tracheal deviation. No acute osseous finding. IMPRESSION: 1. Cardiomegaly with basilar atelectasis versus early pneumonia. 2. Trace right effusion. Electronically Signed   By: CHRISTELLA.  Shick M.D.   On: 12/19/2023 12:04    Assessment/Plan Primary hypertension Currently taking diltiazem  as well as metoprolol  for A-fib as well as blood pressure pressure today is good at 130/82  Hypercoagulable state due to paroxysmal atrial fibrillation (HCC) Currently on  calcium  channel blocker and beta-blocker as well as Eliquis .  No aggressive plans are contemplated at this time.  Rate does vary patient is asymptomatic even with higher rate  OP (osteoporosis) Takes Prolia  for osteoporosis    Family/ staff Communication: Family present at visit  Labs/tests ordered: none  Garnette CHRISTELLA. Cleotilde, MD [facility] [address] [place], [place] [address] Office [phone]

## 2023-12-26 NOTE — Assessment & Plan Note (Signed)
 Currently taking diltiazem  as well as metoprolol  for A-fib as well as blood pressure pressure today is good at 130/82

## 2023-12-26 NOTE — Assessment & Plan Note (Signed)
 Takes Prolia  for osteoporosis

## 2024-01-01 LAB — CBC AND DIFFERENTIAL
HCT: 35 — AB (ref 36–46)
Hemoglobin: 10.8 — AB (ref 12.0–16.0)
Neutrophils Absolute: 5023
Platelets: 604 K/uL — AB (ref 150–400)
WBC: 7.8

## 2024-01-01 LAB — COMPREHENSIVE METABOLIC PANEL WITH GFR
Calcium: 9.2 (ref 8.7–10.7)
eGFR: 58

## 2024-01-01 LAB — BASIC METABOLIC PANEL WITH GFR
BUN: 18 (ref 4–21)
CO2: 27 — AB (ref 13–22)
Chloride: 105 (ref 99–108)
Creatinine: 0.9 (ref 0.5–1.1)
Glucose: 104
Potassium: 4.8 meq/L (ref 3.5–5.1)
Sodium: 140 (ref 137–147)

## 2024-01-01 LAB — CBC: RBC: 4.1 (ref 3.87–5.11)

## 2024-01-03 ENCOUNTER — Encounter: Payer: Self-pay | Admitting: Adult Health

## 2024-01-03 ENCOUNTER — Non-Acute Institutional Stay (SKILLED_NURSING_FACILITY): Payer: Self-pay | Admitting: Adult Health

## 2024-01-03 DIAGNOSIS — I4891 Unspecified atrial fibrillation: Secondary | ICD-10-CM | POA: Diagnosis not present

## 2024-01-03 DIAGNOSIS — I10 Essential (primary) hypertension: Secondary | ICD-10-CM

## 2024-01-03 DIAGNOSIS — J189 Pneumonia, unspecified organism: Secondary | ICD-10-CM | POA: Diagnosis not present

## 2024-01-03 NOTE — Progress Notes (Unsigned)
 Location:  [facility] Room Number: CEDAR CERT [id number] Place of Service:  SNF (31) Provider:  Medina-Vargas, Zaelynn Fuchs, DNP, FNP-BC  Patient Care Team: Shayne Anes, MD as PCP - General (Internal Medicine) Thukkani, Arun K, MD as PCP - Cardiology (Cardiology)  Extended Emergency Contact Information Primary Emergency Contact: Bencosme Montgomery,betsy Home Phone: [phone] Mobile Phone: [phone] Relation: Daughter  Code Status:  Full Code  Goals of care: Advanced Directive information    01/03/2024   11:09 AM  Advanced Directives  Does Patient Have a Medical Advance Directive? No  Does patient want to make changes to medical advance directive? No - Patient declined  Would patient like information on creating a medical advance directive? No - Patient declined     Chief Complaint  Patient presents with   Discharge Note    Discharge to Independent Living 01/04/24    HPI:  Pt is a 89 y.o. female seen today for medical management of chronic diseases.  ***   Past Medical History:  Diagnosis Date   Hypertension    Osteoporosis    History reviewed. No pertinent surgical history.  Allergies[1]  Outpatient Encounter Medications as of 01/03/2024  Medication Sig   acetaminophen (TYLENOL 8 HOUR ARTHRITIS PAIN) 650 MG CR tablet Take 650 mg by mouth every 8 (eight) hours as needed for pain.   apixaban  (ELIQUIS ) 5 MG TABS tablet TAKE 1 TABLET BY MOUTH TWICE A DAY   atorvastatin  (LIPITOR) 10 MG tablet Take 10 mg by mouth at bedtime.   Cholecalciferol 25 MCG (1000 UT) capsule Take 1,000 Units by mouth daily.   denosumab  (PROLIA ) 60 MG/ML SOSY injection Inject 60 mg into the skin every 6 (six) months.   diltiazem  (CARDIZEM  CD) 360 MG 24 hr capsule Take 1 capsule (360 mg total) by mouth daily.   fluticasone (FLONASE) 50 MCG/ACT nasal spray Place 1 spray into both nostrils as needed for allergies or rhinitis.   magnesium  oxide (MAG-OX) 400 (240 Mg)  MG tablet Take 1 tablet (400 mg total) by mouth daily.   metoprolol  tartrate (LOPRESSOR ) 50 MG tablet Take 1 tablet (50 mg total) by mouth 2 (two) times daily.   Loratadine (CLARITIN) 10 MG CAPS Take 10 mg by mouth daily as needed (allergies). (Patient not taking: Reported on 01/03/2024)   No facility-administered encounter medications on file as of 01/03/2024.    Review of Systems ***    Immunization History  Administered Date(s) Administered   Influenza Whole 10/29/2008   Pneumococcal Polysaccharide-23 01/17/2003   Td 01/16/2006   Pertinent  Health Maintenance Due  Topic Date Due   Bone Density Scan  Never done   Influenza Vaccine  Completed      03/06/2018    1:27 PM 03/19/2018    5:35 PM  Fall Risk  (RETIRED) Patient Fall Risk Level Moderate fall risk  Moderate fall risk      Data saved with a previous flowsheet row definition     Vitals:   01/03/24 1104  BP: (!) 144/79  Pulse: (!) 122  Resp: 18  Temp: (!) 97.5 F (36.4 C)  SpO2: 93%  Weight: 180 lb (81.6 kg)  Height: 5' 2.99 (1.6 m)   Body mass index is 31.9 kg/m.  Physical Exam     Labs reviewed: Recent Labs    12/21/23 0357 12/24/23 1121 12/25/23 0247 01/01/24 0000  NA 140 135 136 140  K 4.1 4.3 4.1 4.8  CL 104 106 107 105  CO2  25 23 20* 27*  GLUCOSE 139* 180* 118*  --   BUN 25* 15 17 18   CREATININE 1.18* 1.00 1.02* 0.9  CALCIUM  8.9 8.3* 8.3* 9.2  MG 2.0 2.1 2.1  --    Recent Labs    12/19/23 1142 12/21/23 0357  AST 48* 27  ALT 36 24  ALKPHOS 62 61  BILITOT 1.0 1.0  PROT 5.8* 5.7*  ALBUMIN 2.8* 2.6*   Recent Labs    12/19/23 1142 12/19/23 1155 12/21/23 0357 12/24/23 1121 12/25/23 0247 01/01/24 0000  WBC 14.1*  --  12.5* 19.7* 14.0* 7.8  NEUTROABS 12.4*  --  8.8*  --   --  5,023.00  HGB 11.6*   < > 11.2* 12.9 11.1* 10.8*  HCT 36.9   < > 36.0 40.2 35.0* 35*  MCV 86.4  --  85.7 84.8 85.2  --   PLT 808*  --  676* 584* 494* 604*   < > = values in this interval not displayed.    Lab Results  Component Value Date   TSH 1.371 12/24/2023   No results found for: HGBA1C Lab Results  Component Value Date   CHOL 138 02/09/2009   HDL 34.10 (L) 02/09/2009   LDLCALC 73 02/09/2009   LDLDIRECT 143.2 10/29/2008   TRIG 156.0 (H) 02/09/2009   CHOLHDL 4 02/09/2009    Significant Diagnostic Results in last 30 days:  DG Chest 2 View Result Date: 12/24/2023 EXAM: 2 VIEW(S) XRAY OF THE CHEST 12/24/2023 03:33:00 PM COMPARISON: 12/19/2023 CLINICAL HISTORY: Pneumonia FINDINGS: LUNGS AND PLEURA: Small bilateral pleural effusions. Increasing left basilar opacity. Biapical pleural and pulmonary scarring. No pneumothorax. HEART AND MEDIASTINUM: Stable cardiomegaly. Aortic atherosclerosis. No acute abnormality of the mediastinal silhouette. BONES AND SOFT TISSUES: No acute osseous abnormality. IMPRESSION: 1. Increasing left basilar opacity, suspicious for pneumonia. 2. Small bilateral pleural effusions. Electronically signed by: Morgane Naveau MD 12/24/2023 07:19 PM EST RP Workstation: [id number]   ECHOCARDIOGRAM COMPLETE Result Date: 12/20/2023    ECHOCARDIOGRAM REPORT   Patient Name:   [name] Date of Exam: 12/20/2023 Medical Rec #:  [id number]     Height:       63.0 in Accession #:    [id number]    Weight:       180.0 lb Date of Birth:  06-03-1924      BSA:          1.849 m Patient Age:    89 years      BP:           107/60 mmHg Patient Gender: F             HR:           92 bpm. Exam Location:  Inpatient Procedure: 2D Echo, Cardiac Doppler and Color Doppler (Both Spectral and Color            Flow Doppler were utilized during procedure). Indications:    Atrial Fibrillation  History:        Patient has no prior history of Echocardiogram examinations.                 Arrythmias:Atrial Fibrillation; Risk Factors:Hypertension and                 Dyslipidemia.  Sonographer:    Juliene Rucks Referring Phys: [id number] LINDSAY B ROBERTS IMPRESSIONS  1. Left ventricular ejection fraction, by  estimation, is 55 to 60%. The left ventricle has normal function. Left ventricular endocardial border not optimally defined  to evaluate regional wall motion. Left ventricular diastolic function could not be evaluated.  2. Right ventricular systolic function is mildly reduced. The right ventricular size is normal. There is mildly elevated pulmonary artery systolic pressure.  3. Left atrial size was severely dilated.  4. The mitral valve is normal in structure. Mild mitral valve regurgitation.  5. The aortic valve is calcified. Aortic valve regurgitation is not visualized. Aortic valve sclerosis/calcification is present, without any evidence of aortic stenosis. Comparison(s): No prior Echocardiogram. FINDINGS  Left Ventricle: Left ventricular ejection fraction, by estimation, is 55 to 60%. The left ventricle has normal function. Left ventricular endocardial border not optimally defined to evaluate regional wall motion. The left ventricular internal cavity size was normal in size. There is no left ventricular hypertrophy. Left ventricular diastolic function could not be evaluated due to atrial fibrillation. Left ventricular diastolic function could not be evaluated. Right Ventricle: The right ventricular size is normal. Right vetricular wall thickness was not well visualized. Right ventricular systolic function is mildly reduced. There is mildly elevated pulmonary artery systolic pressure. The tricuspid regurgitant velocity is 2.88 m/s, and with an assumed right atrial pressure of 3 mmHg, the estimated right ventricular systolic pressure is 36.2 mmHg. Left Atrium: Left atrial size was severely dilated. Right Atrium: Right atrial size was not well visualized. Pericardium: The pericardium was not well visualized. Mitral Valve: The mitral valve is normal in structure. Mild mitral valve regurgitation. Tricuspid Valve: The tricuspid valve is normal in structure. Tricuspid valve regurgitation is mild. Aortic Valve: The  aortic valve is calcified. Aortic valve regurgitation is not visualized. Aortic valve sclerosis/calcification is present, without any evidence of aortic stenosis. Pulmonic Valve: The pulmonic valve was not well visualized. Pulmonic valve regurgitation is not visualized. Aorta: The aortic root is normal in size and structure. Venous: The inferior vena cava was not well visualized. IAS/Shunts: The interatrial septum was not well visualized.  LEFT VENTRICLE PLAX 2D LVIDd:         5.00 cm      Diastology LVIDs:         3.30 cm      LV e' medial:  5.51 cm/s LV PW:         1.00 cm      LV e' lateral: 3.65 cm/s LV IVS:        0.90 cm LVOT diam:     1.80 cm LVOT Area:     2.54 cm  LV Volumes (MOD) LV vol d, MOD A4C: 116.0 ml LV vol s, MOD A4C: 52.1 ml LV SV MOD A4C:     116.0 ml RIGHT VENTRICLE            IVC RV Basal diam:  3.10 cm    IVC diam: 1.90 cm RV Mid diam:    2.60 cm RV S prime:     5.59 cm/s TAPSE (M-mode): 0.9 cm LEFT ATRIUM            Index        RIGHT ATRIUM           Index LA diam:      3.30 cm  1.78 cm/m   RA Area:     15.40 cm LA Vol (A4C): 123.0 ml 66.52 ml/m  RA Volume:   37.80 ml  20.44 ml/m   AORTA Ao Root diam: 2.70 cm TRICUSPID VALVE TR Peak grad:   33.2 mmHg TR Vmax:        288.00 cm/s  SHUNTS  Systemic Diam: 1.80 cm Joelle Cedars Tonleu Electronically signed by Joelle Cedars Ny Signature Date/Time: 12/20/2023/1:17:54 PM    Final    DG Chest Port 1 View Result Date: 12/19/2023 CLINICAL DATA:  Shortness of breath, fever and chest pain, atrial fibrillation EXAM: PORTABLE CHEST 1 VIEW COMPARISON:  04/05/2018 FINDINGS: Mild cardiomegaly and slight ill-defined hazy basilar opacities, favored to be atelectasis. Difficult to exclude early basilar pneumonia. Suspect trace right effusion. Aorta atherosclerotic and tortuous. Degenerative changes of the spine. Bones are osteopenic. Similar rightward tracheal deviation. No acute osseous finding. IMPRESSION: 1. Cardiomegaly with basilar atelectasis  versus early pneumonia. 2. Trace right effusion. Electronically Signed   By: CHRISTELLA.  Shick M.D.   On: 12/19/2023 12:04    Assessment/Plan ***   Family/ staff Communication: Discussed plan of care with resident and charge nurse  Labs/tests ordered:     Jamieson Lisa Medina-Vargas, DNP, MSN, FNP-BC [facility] [phone] (Monday-Friday 8:00 a.m. - 5:00 p.m.) [phone] (after hours)     [1]  Allergies Allergen Reactions   Estrogens Other (See Comments)   Ezetimibe-Simvastatin     Other Reaction(s): pain in liver region   Mite (D. Farinae) Other (See Comments)   Simvastatin     Other Reaction(s): cramps in legs.

## 2024-01-04 MED ORDER — METOPROLOL TARTRATE 50 MG PO TABS: 50.0000 mg | ORAL_TABLET | Freq: Two times a day (BID) | ORAL | 0 refills | Status: AC

## 2024-01-04 MED ORDER — MAGNESIUM OXIDE -MG SUPPLEMENT 400 (240 MG) MG PO TABS: 400.0000 mg | ORAL_TABLET | Freq: Every day | ORAL | 0 refills | Status: AC

## 2024-01-04 MED ORDER — APIXABAN 5 MG PO TABS: 5.0000 mg | ORAL_TABLET | Freq: Two times a day (BID) | ORAL | 0 refills | Status: AC

## 2024-01-04 MED ORDER — ATORVASTATIN CALCIUM 10 MG PO TABS: 10.0000 mg | ORAL_TABLET | Freq: Every day | ORAL | 0 refills | Status: AC

## 2024-01-04 MED ORDER — DILTIAZEM HCL ER COATED BEADS 360 MG PO CP24: 360.0000 mg | ORAL_CAPSULE | Freq: Every day | ORAL | 0 refills | Status: DC

## 2024-01-11 ENCOUNTER — Encounter (HOSPITAL_COMMUNITY): Payer: Self-pay | Admitting: Internal Medicine

## 2024-01-16 ENCOUNTER — Other Ambulatory Visit (HOSPITAL_COMMUNITY): Payer: Self-pay | Admitting: Internal Medicine

## 2024-01-16 NOTE — Progress Notes (Unsigned)
 " Cardiology Office Note   Date:  01/16/2024  ID:  [name], DOB 10-27-1924, MRN [id number] PCP: Shayne Anes, MD  [facility] Providers Cardiologist:  Arun K Thukkani, MD   History of Present Illness [name] is a 89 y.o. female with a past medical history significant for atrial fibrillation (seen by the A-fib clinic), hypertension and osteoporosis here for hospital follow-up.  Was recently seen in the ER for rapid atrial fibrillation.  Recently was diagnosed with right-sided pneumonia about 10 days prior and treated with Levaquin for 5 days.  Fever improved.  Seen by her primary care and found to have A-fib with RVR with heart rate 120 to 180 bpm and sent to the ER.  She received 20 mg of IV Cardizem  on the way.  In the emergency room electrolytes were adequate.  Magnesium  1.6.  Troponins negative.  Lactic acid 1.9.  WBC 14.  Respiratory virus panel negative.  Chest x-ray with cardiomegaly, bibasilar atelectasis and right sided basilar atelectasis.  Started on IV Rocephin  and azithromycin , IV Cardizem  infusion and admitted to the hospital.  Remained in the hospital fluctuating heart rate occasionally RVR.  Followed by cardiology at this time.  She lives at friend[facility] home and the plan was to go to a SNF.  It was decided on conservative management for rhythm control with Cardizem  360 mg daily, metoprolol  50 mg twice a day.  Tolerating Eliquis .  Today, she***  ROS: Pertinent ROS in HPI  Studies Reviewed     Echocardiogram 12/20/23  IMPRESSIONS     1. Left ventricular ejection fraction, by estimation, is 55 to 60%. The  left ventricle has normal function. Left ventricular endocardial border  not optimally defined to evaluate regional wall motion. Left ventricular  diastolic function could not be  evaluated.   2. Right ventricular systolic function is mildly reduced. The right  ventricular size is normal. There is mildly elevated pulmonary artery  systolic pressure.    3. Left atrial size was severely dilated.   4. The mitral valve is normal in structure. Mild mitral valve  regurgitation.   5. The aortic valve is calcified. Aortic valve regurgitation is not  visualized. Aortic valve sclerosis/calcification is present, without any  evidence of aortic stenosis.   Comparison(s): No prior Echocardiogram.   FINDINGS   Left Ventricle: Left ventricular ejection fraction, by estimation, is 55  to 60%. The left ventricle has normal function. Left ventricular  endocardial border not optimally defined to evaluate regional wall motion.  The left ventricular internal cavity  size was normal in size. There is no left ventricular hypertrophy. Left  ventricular diastolic function could not be evaluated due to atrial  fibrillation. Left ventricular diastolic function could not be evaluated.   Right Ventricle: The right ventricular size is normal. Right vetricular  wall thickness was not well visualized. Right ventricular systolic  function is mildly reduced. There is mildly elevated pulmonary artery  systolic pressure. The tricuspid regurgitant  velocity is 2.88 m/s, and with an assumed right atrial pressure of 3 mmHg,  the estimated right ventricular systolic pressure is 36.2 mmHg.   Left Atrium: Left atrial size was severely dilated.   Right Atrium: Right atrial size was not well visualized.   Pericardium: The pericardium was not well visualized.   Mitral Valve: The mitral valve is normal in structure. Mild mitral valve  regurgitation.   Tricuspid Valve: The tricuspid valve is normal in structure. Tricuspid  valve regurgitation is mild.  Aortic Valve: The aortic valve is calcified. Aortic valve regurgitation is  not visualized. Aortic valve sclerosis/calcification is present, without  any evidence of aortic stenosis.   Pulmonic Valve: The pulmonic valve was not well visualized. Pulmonic valve  regurgitation is not visualized.   Aorta: The aortic root  is normal in size and structure.   Venous: The inferior vena cava was not well visualized.   IAS/Shunts: The interatrial septum was not well visualized.   Risk Assessment/Calculations {Does this patient have ATRIAL FIBRILLATION?:[phone]} No BP recorded.  {Refresh Note OR Click here to enter BP  :1}***       Physical Exam VS:  There were no vitals taken for this visit.       Wt Readings from Last 3 Encounters:  01/03/24 180 lb (81.6 kg)  12/26/23 180 lb (81.6 kg)  12/19/23 180 lb (81.6 kg)    GEN: Well nourished, well developed in no acute distress NECK: No JVD; No carotid bruits CARDIAC: ***RRR, no murmurs, rubs, gallops RESPIRATORY:  Clear to auscultation without rales, wheezing or rhonchi  ABDOMEN: Soft, non-tender, non-distended EXTREMITIES:  No edema; No deformity   ASSESSMENT AND PLAN Atrial fibrillation with RVR Hypertension    {Are you ordering a CV Procedure (e.g. stress test, cath, DCCV, TEE, etc)?   Press F2        :[id number]}  Dispo: ***  Signed, Orren LOISE Fabry, PA-C   "

## 2024-01-16 NOTE — Progress Notes (Signed)
 Duplicate Prolia  referral received.  Of note, CMP on 12/19/2023 attached. Calcium  wnl at 9.9  Sherry Pennant, PharmD, MPH, BCPS, CPP Clinical Pharmacist

## 2024-01-18 ENCOUNTER — Ambulatory Visit: Attending: Physician Assistant | Admitting: Physician Assistant

## 2024-01-18 ENCOUNTER — Encounter: Payer: Self-pay | Admitting: Physician Assistant

## 2024-01-18 VITALS — BP 100/80 | HR 76 | Ht 63.0 in | Wt 164.0 lb

## 2024-01-18 DIAGNOSIS — I4891 Unspecified atrial fibrillation: Secondary | ICD-10-CM | POA: Insufficient documentation

## 2024-01-18 DIAGNOSIS — I10 Essential (primary) hypertension: Secondary | ICD-10-CM | POA: Diagnosis not present

## 2024-01-18 DIAGNOSIS — I480 Paroxysmal atrial fibrillation: Secondary | ICD-10-CM | POA: Diagnosis not present

## 2024-01-18 DIAGNOSIS — D6869 Other thrombophilia: Secondary | ICD-10-CM | POA: Diagnosis present

## 2024-01-18 MED ORDER — DILTIAZEM HCL ER COATED BEADS 360 MG PO CP24: 360.0000 mg | ORAL_CAPSULE | Freq: Every day | ORAL | 2 refills | Status: AC

## 2024-01-18 NOTE — Patient Instructions (Addendum)
 Medication Instructions:  Your physician recommends that you continue on your current medications as directed. Please refer to the Current Medication list given to you today. *If you need a refill on your cardiac medications before your next appointment, please call your pharmacy*  Lab Work: None ordered If you have labs (blood work) drawn today and your tests are completely normal, you will receive your results only by: MyChart Message (if you have MyChart) OR A paper copy in the mail If you have any lab test that is abnormal or we need to change your treatment, we will call you to review the results.  Testing/Procedures: None ordered  Follow-Up: At [facility], you and your health needs are our priority.  As part of our continuing mission to provide you with exceptional heart care, our providers are all part of one team.  This team includes your primary Cardiologist (physician) and Advanced Practice Providers or APPs (Physician Assistants and Nurse Practitioners) who all work together to provide you with the care you need, when you need it.  Your next appointment:   3 month(s)  Provider:   Arun K Thukkani, MD    We recommend signing up for the patient portal called MyChart.  Sign up information is provided on this After Visit Summary.  MyChart is used to connect with patients for Virtual Visits (Telemedicine).  Patients are able to view lab/test results, encounter notes, upcoming appointments, etc.  Non-urgent messages can be sent to your provider as well.   To learn more about what you can do with MyChart, go to [email or web address].   Other Instructions PLEASE CHECK YOUR HEART RATE DAILY AND CONTACT THE OFFICE IF YOUR HEART RATE IS CONSISTENTLY OVER 100.

## 2024-01-21 ENCOUNTER — Ambulatory Visit (HOSPITAL_COMMUNITY)
Admission: RE | Admit: 2024-01-21 | Discharge: 2024-01-21 | Disposition: A | Discharge status: Home / Self Care | Source: Ambulatory Visit | Attending: Internal Medicine | Admitting: Internal Medicine

## 2024-01-21 VITALS — BP 131/100 | HR 106 | Temp 97.7°F | Resp 16

## 2024-01-21 DIAGNOSIS — M810 Age-related osteoporosis without current pathological fracture: Secondary | ICD-10-CM | POA: Insufficient documentation

## 2024-01-21 MED ORDER — DENOSUMAB 60 MG/ML ~~LOC~~ SOSY
60.0000 mg | PREFILLED_SYRINGE | Freq: Once | SUBCUTANEOUS | Status: AC
  Administered 2024-01-21: 60 mg via SUBCUTANEOUS

## 2024-01-21 MED ORDER — DENOSUMAB 60 MG/ML ~~LOC~~ SOSY
PREFILLED_SYRINGE | SUBCUTANEOUS | Status: AC
  Filled 2024-01-21: qty 1

## 2024-02-08 ENCOUNTER — Other Ambulatory Visit: Payer: Self-pay | Admitting: Adult Health

## 2024-02-08 DIAGNOSIS — I4891 Unspecified atrial fibrillation: Secondary | ICD-10-CM

## 2024-07-21 ENCOUNTER — Encounter (HOSPITAL_COMMUNITY)
